# Patient Record
Sex: Male | Born: 1955 | Race: Black or African American | Hispanic: No | Marital: Married | Smoking: Current some day smoker
Health system: Southern US, Community
[De-identification: ages and names within clinical notes are randomized; demographics above are authoritative.]

## PROBLEM LIST (undated history)

## (undated) DIAGNOSIS — K219 Gastro-esophageal reflux disease without esophagitis: Secondary | ICD-10-CM

## (undated) DIAGNOSIS — K0889 Other specified disorders of teeth and supporting structures: Secondary | ICD-10-CM

## (undated) DIAGNOSIS — Z972 Presence of dental prosthetic device (complete) (partial): Secondary | ICD-10-CM

## (undated) DIAGNOSIS — Z974 Presence of external hearing-aid: Secondary | ICD-10-CM

## (undated) DIAGNOSIS — I10 Essential (primary) hypertension: Secondary | ICD-10-CM

## (undated) DIAGNOSIS — M1990 Unspecified osteoarthritis, unspecified site: Secondary | ICD-10-CM

## (undated) DIAGNOSIS — E119 Type 2 diabetes mellitus without complications: Secondary | ICD-10-CM

## (undated) DIAGNOSIS — C801 Malignant (primary) neoplasm, unspecified: Secondary | ICD-10-CM

## (undated) HISTORY — DX: Unspecified osteoarthritis, unspecified site: M19.90

## (undated) HISTORY — PX: EYE SURGERY: SHX253

## (undated) HISTORY — DX: Malignant (primary) neoplasm, unspecified: C80.1

---

## 2005-06-25 ENCOUNTER — Emergency Department: Payer: Self-pay | Admitting: Emergency Medicine

## 2005-11-22 DIAGNOSIS — C801 Malignant (primary) neoplasm, unspecified: Secondary | ICD-10-CM

## 2005-11-22 HISTORY — DX: Malignant (primary) neoplasm, unspecified: C80.1

## 2005-11-22 HISTORY — PX: RIB RESECTION: SHX5077

## 2006-04-11 ENCOUNTER — Other Ambulatory Visit: Payer: Self-pay

## 2006-04-11 ENCOUNTER — Emergency Department: Payer: Self-pay | Admitting: Emergency Medicine

## 2006-04-15 ENCOUNTER — Other Ambulatory Visit: Payer: Self-pay

## 2006-04-15 ENCOUNTER — Inpatient Hospital Stay: Payer: Self-pay | Admitting: Internal Medicine

## 2006-10-22 HISTORY — PX: LUNG SURGERY: SHX703

## 2007-06-18 IMAGING — CR DG CHEST 2V
1 series · 4 of 4 positions shown · non-contrast
Comparison: none

REASON FOR EXAM: Chest pain
COMMENTS:  LMP: (Male)

[Series 1: view not recorded · 0.17mm/px · 4 of 4 slices shown]
[im 1/4]
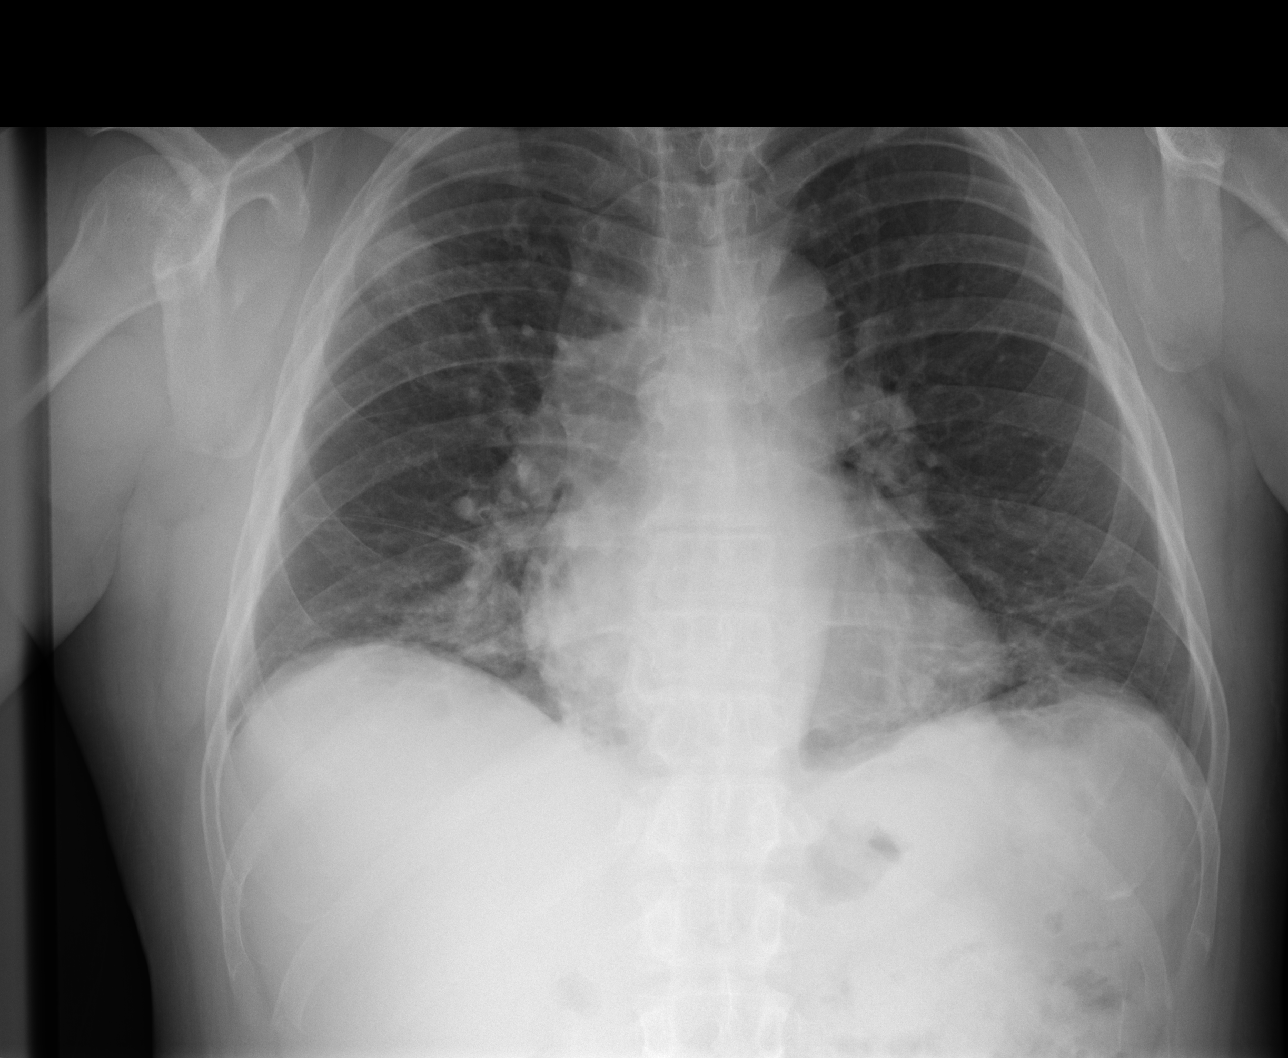
[im 2/4]
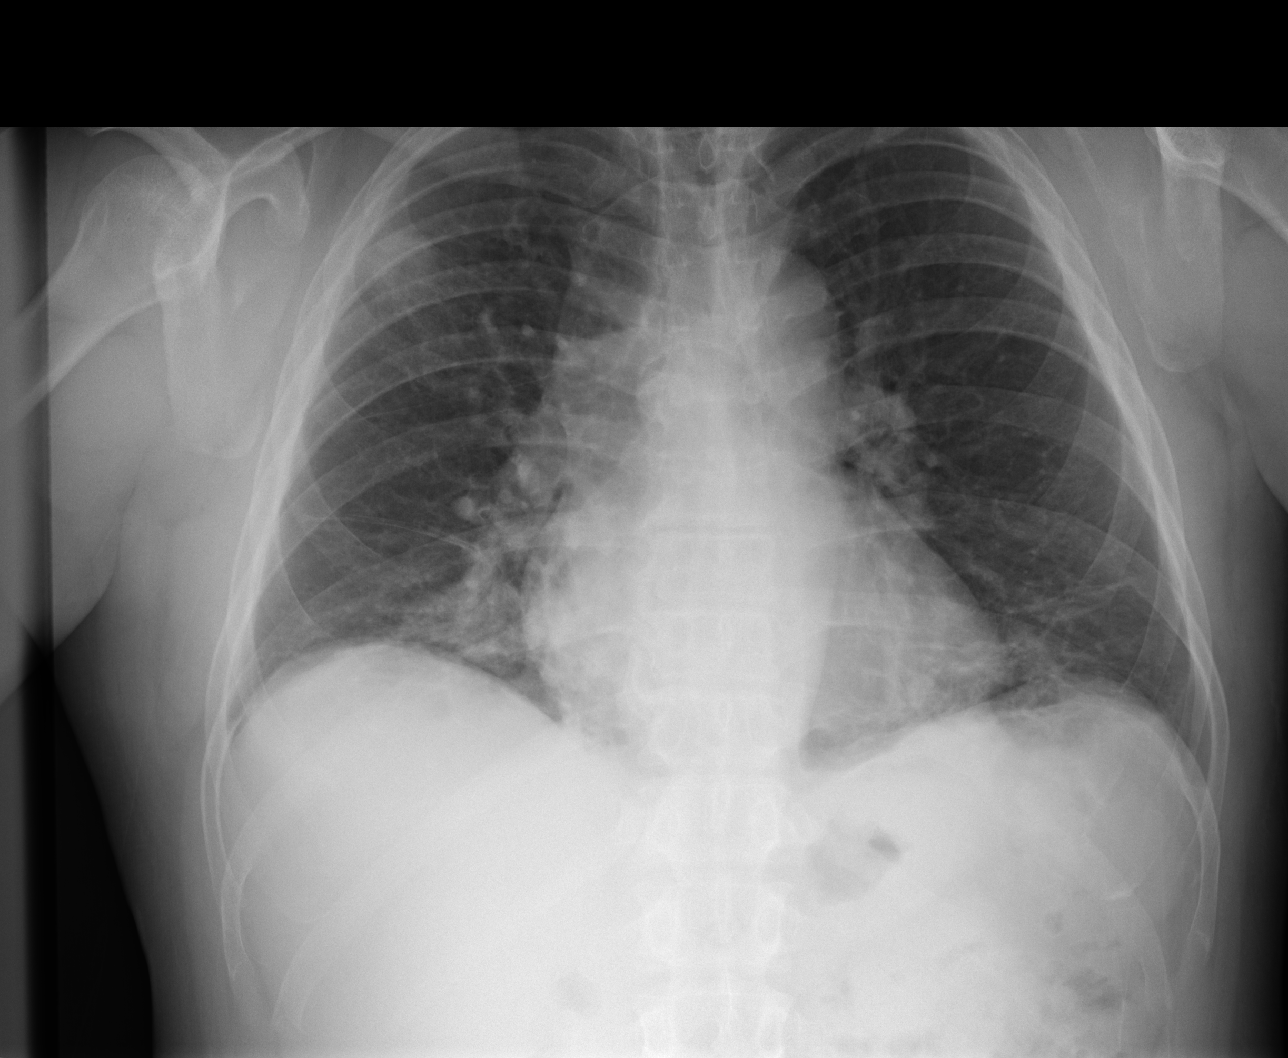
[im 3/4]
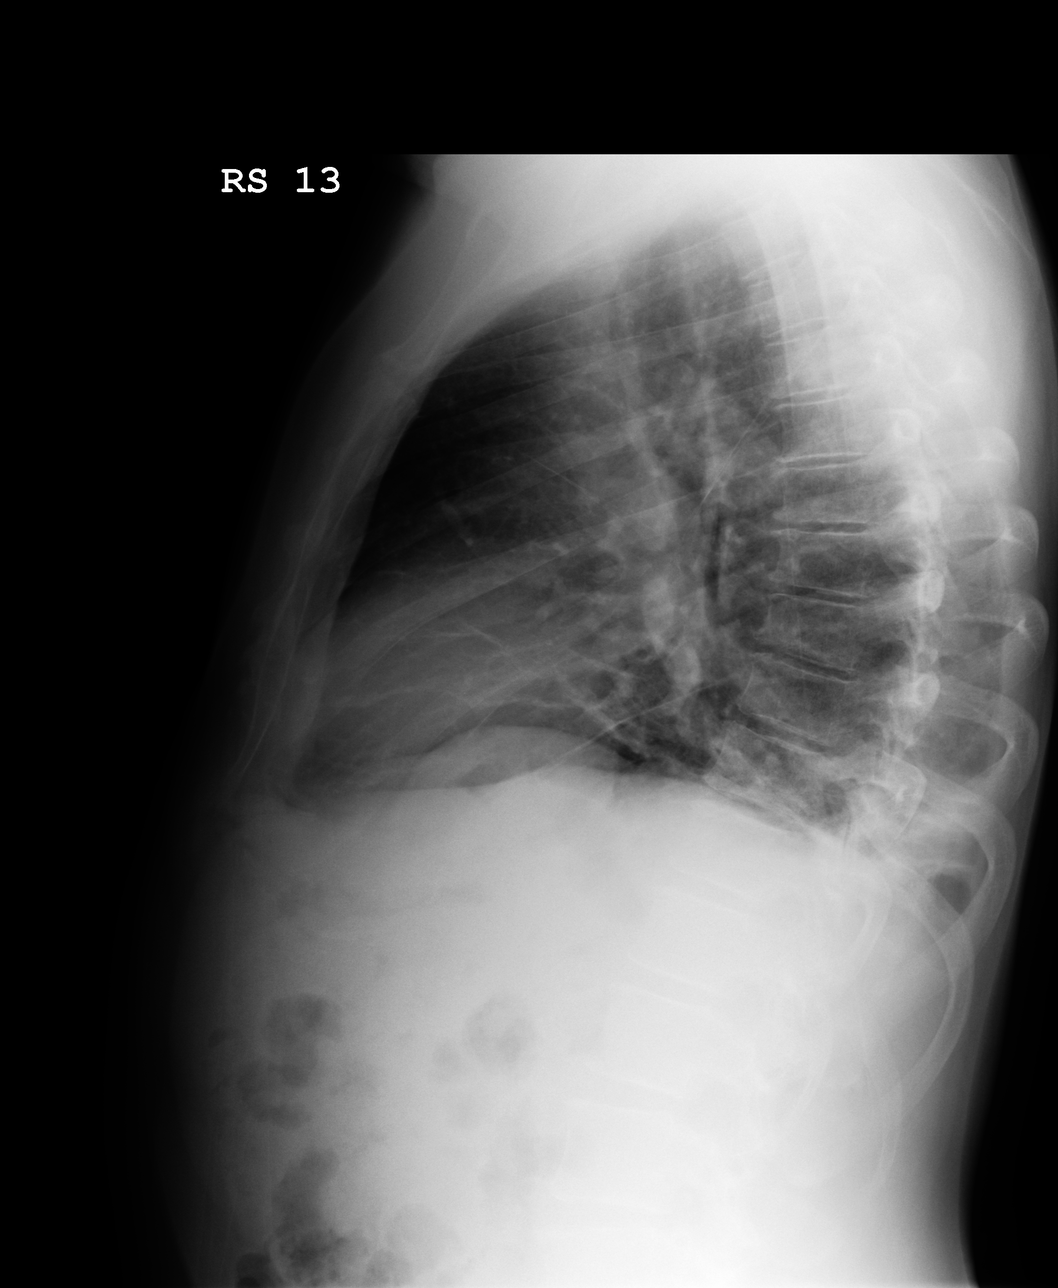
[im 4/4]
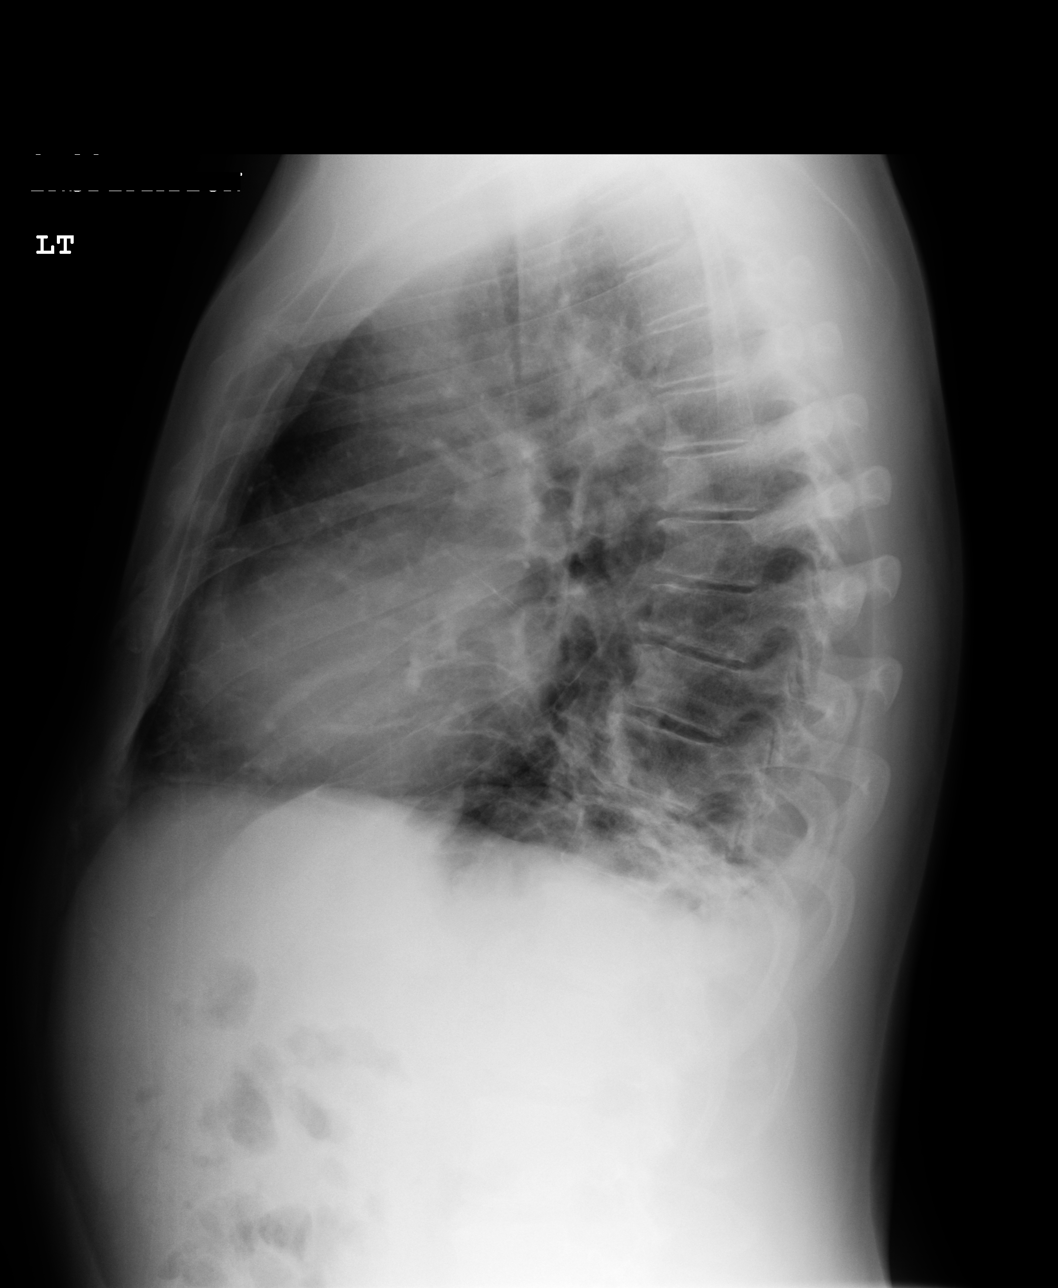

[4 of 4 positions shown; findings below may reference images not displayed]

PROCEDURE:     DXR - DXR CHEST PA (OR AP) AND LATERAL  - April 11, 2006  [DATE]

RESULT:     There is a patchy infiltrate in both lung bases compatible with
atelectasis or pneumonia. In addition, there is a concentric density in the
RIGHT upper lobe suspicious for RIGHT upper lobe pneumonia although this is
seen only in the PA view and could represent a fibrotic plaque. Follow-up
examination until clear is recommended. If the density in the RIGHT upper
lobe does not clear then further evaluation by CT may be needed to exclude a
nodule. Overall heart size is normal. There is no pleural effusion.
IMPRESSION: 1.     There is increased density in the lung bases bilaterally compatible
with pneumonia and/or atelectasis.
2.     There is a nonspecific density in the RIGHT upper lobe for which
follow-up examination is recommended.

## 2013-01-09 DIAGNOSIS — C3411 Malignant neoplasm of upper lobe, right bronchus or lung: Secondary | ICD-10-CM | POA: Insufficient documentation

## 2014-11-22 HISTORY — PX: COLONOSCOPY: SHX174

## 2014-12-23 ENCOUNTER — Emergency Department: Payer: Self-pay | Admitting: Emergency Medicine

## 2015-01-01 ENCOUNTER — Encounter: Payer: Self-pay | Admitting: General Surgery

## 2015-01-16 ENCOUNTER — Other Ambulatory Visit: Payer: Self-pay | Admitting: *Deleted

## 2015-01-16 ENCOUNTER — Ambulatory Visit (INDEPENDENT_AMBULATORY_CARE_PROVIDER_SITE_OTHER): Payer: Medicare Other | Admitting: General Surgery

## 2015-01-16 ENCOUNTER — Encounter: Payer: Self-pay | Admitting: General Surgery

## 2015-01-16 ENCOUNTER — Other Ambulatory Visit: Payer: Self-pay | Admitting: General Surgery

## 2015-01-16 VITALS — BP 118/82 | HR 70 | Resp 12 | Ht 63.0 in | Wt 210.0 lb

## 2015-01-16 DIAGNOSIS — Z1211 Encounter for screening for malignant neoplasm of colon: Secondary | ICD-10-CM

## 2015-01-16 MED ORDER — POLYETHYLENE GLYCOL 3350 17 GM/SCOOP PO POWD: ORAL | Status: DC

## 2015-01-16 NOTE — Progress Notes (Signed)
Patient ID: Larry Luna, male   DOB: [birth year], 59 y.o.   MRN: [id number]  Chief Complaint  Patient presents with  . Other    evaluation of screening colonoscopy    HPI Larry Luna is a 59 y.o. male who presents for an evaluation of a colonoscopy. No prior colonoscopies done before. No known family history of colon problems. He denies any problems with his bowels at this time.  The patient reports he was encouraged both by his primary care physician and the physicians at [facility] where he is followed for his lung cancer to have a screening exam.  In spite of his previous lung cancer, which was in part attributed to chemical exposure [place], he still smokes.   He denies any dyspnea with exertion. While he is been disabled since his lung surgery he does care for his yard without difficulty.  HPI  Past Medical History  Diagnosis Date  . Cancer 2007    lung  . Arthritis     Past Surgical History  Procedure Laterality Date  . Lung surgery Right 10/2006    lung cancer surgery - [facility]  . Rib resection Right 2007    Family History  Problem Relation Age of Onset  . Heart attack Father   . Heart disease Mother   . Cancer Sister     breast    Social History History  Substance Use Topics  . Smoking status: Current Every Day Smoker -- 0.50 packs/day for 2 years  . Smokeless tobacco: Never Used  . Alcohol Use: No    Allergies  Allergen Reactions  . Bee Venom Anaphylaxis    Current Outpatient Prescriptions  Medication Sig Dispense Refill  . acetaminophen (TYLENOL) 325 MG tablet Take 650 mg by mouth as needed.    Marland Kitchen aspirin 81 MG tablet Take 81 mg by mouth daily.     No current facility-administered medications for this visit.    Review of Systems Review of Systems  Constitutional: Negative.   Respiratory: Negative.   Cardiovascular: Negative.   Gastrointestinal: Negative.     Blood pressure 118/82, pulse 70, resp. rate 12, height 5\' 3"  (1.6  m), weight 210 lb (95.255 kg).  Physical Exam Physical Exam  Constitutional: He is oriented to person, place, and time. He appears well-developed and well-nourished.  Neck: Neck supple. No thyromegaly present.  Cardiovascular: Normal rate, regular rhythm and normal heart sounds.   No murmur heard. Pulmonary/Chest: Effort normal and breath sounds normal.  Lymphadenopathy:    He has no cervical adenopathy.  Neurological: He is alert and oriented to person, place, and time.  Skin: Skin is warm and dry.    Data Reviewed PCP records from 07/26/2014 were reviewed.  Laboratory studies of the same date showed a hemoglobin of 15.9 with an MCV of 81, white blood cell count of 8300. Platelets 234,000. Creatinine 1.14 with an estimated GFR of 82. Normal electrolytes. Normal PSA of 3.6.  Assessment    Candidate for screening colonoscopy.    Plan    Patient has been scheduled for a colonoscopy on 02-12-15 at [facility]. It is okay for patient to continue 81 mg aspirin once daily.    PCP/Ref MD:  Henderson Baltimore 01/16/2015, 9:50 AM

## 2015-01-16 NOTE — Patient Instructions (Addendum)
Colonoscopy A colonoscopy is an exam to look at the entire large intestine (colon). This exam can help find problems such as tumors, polyps, inflammation, and areas of bleeding. The exam takes about 1 hour.  LET [facility] CARE PROVIDER KNOW ABOUT:   Any allergies you have.  All medicines you are taking, including vitamins, herbs, eye drops, creams, and over-the-counter medicines.  Previous problems you or members of your family have had with the use of anesthetics.  Any blood disorders you have.  Previous surgeries you have had.  Medical conditions you have. RISKS AND COMPLICATIONS  Generally, this is a safe procedure. However, as with any procedure, complications can occur. Possible complications include:  Bleeding.  Tearing or rupture of the colon wall.  Reaction to medicines given during the exam.  Infection (rare). BEFORE THE PROCEDURE   Ask your health care provider about changing or stopping your regular medicines.  You may be prescribed an oral bowel prep. This involves drinking a large amount of medicated liquid, starting the day before your procedure. The liquid will cause you to have multiple loose stools until your stool is almost clear or light green. This cleans out your colon in preparation for the procedure.  Do not eat or drink anything else once you have started the bowel prep, unless your health care provider tells you it is safe to do so.  Arrange for someone to drive you home after the procedure. PROCEDURE   You will be given medicine to help you relax (sedative).  You will lie on your side with your knees bent.  A long, flexible tube with a light and camera on the end (colonoscope) will be inserted through the rectum and into the colon. The camera sends video back to a computer screen as it moves through the colon. The colonoscope also releases carbon dioxide gas to inflate the colon. This helps your health care provider see the area better.  During  the exam, your health care provider may take a small tissue sample (biopsy) to be examined under a microscope if any abnormalities are found.  The exam is finished when the entire colon has been viewed. AFTER THE PROCEDURE   Do not drive for 24 hours after the exam.  You may have a small amount of blood in your stool.  You may pass moderate amounts of gas and have mild abdominal cramping or bloating. This is caused by the gas used to inflate your colon during the exam.  Ask when your test results will be ready and how you will get your results. Make sure you get your test results. Document Released: 11/05/2000 Document Revised: 08/29/2013 Document Reviewed: 07/16/2013 [facility] Patient Information 2015 [place], [place]. This information is not intended to replace advice given to you by your health care provider. Make sure you discuss any questions you have with your health care provider.  Patient has been scheduled for a colonoscopy on 02-12-15 at [facility]. It is okay for patient to continue 81 mg aspirin once daily.

## 2015-02-12 ENCOUNTER — Ambulatory Visit: Payer: Self-pay | Admitting: General Surgery

## 2015-02-12 DIAGNOSIS — D122 Benign neoplasm of ascending colon: Secondary | ICD-10-CM | POA: Diagnosis not present

## 2015-02-12 DIAGNOSIS — D125 Benign neoplasm of sigmoid colon: Secondary | ICD-10-CM | POA: Diagnosis not present

## 2015-02-12 DIAGNOSIS — D124 Benign neoplasm of descending colon: Secondary | ICD-10-CM | POA: Diagnosis not present

## 2015-02-12 DIAGNOSIS — K621 Rectal polyp: Secondary | ICD-10-CM | POA: Diagnosis not present

## 2015-02-13 ENCOUNTER — Encounter: Payer: Self-pay | Admitting: General Surgery

## 2015-02-17 ENCOUNTER — Encounter: Payer: Self-pay | Admitting: General Surgery

## 2015-02-17 ENCOUNTER — Telehealth: Payer: Self-pay

## 2015-02-17 NOTE — Telephone Encounter (Signed)
-----   Message from Robert Bellow, MD sent at 02/17/2015 10:29 AM EDT ----- Please notify the patient although polyps removed from his colonoscopy in March 23 were benign. Because he had 4 polyps a follow-up exam in 3 years is recommended. Please put him in the reminder file. Thank yo ----- Message -----    From: Augustin Schooling, CMA    Sent: 02/17/2015  10:18 AM      To: Robert Bellow, MD

## 2015-02-17 NOTE — Telephone Encounter (Signed)
Notified patient as instructed, patient pleased. Discussed follow-up in 3 years, patient agrees. Patient placed in recalls for a return in 3 years.

## 2015-03-17 LAB — SURGICAL PATHOLOGY

## 2017-08-06 ENCOUNTER — Emergency Department
Admission: EM | Admit: 2017-08-06 | Discharge: 2017-08-06 | Disposition: A | Discharge status: Home / Self Care | Payer: Medicare HMO | Attending: Emergency Medicine | Admitting: Emergency Medicine

## 2017-08-06 ENCOUNTER — Emergency Department: Payer: Medicare HMO

## 2017-08-06 ENCOUNTER — Encounter: Payer: Self-pay | Admitting: Medical Oncology

## 2017-08-06 DIAGNOSIS — Y9389 Activity, other specified: Secondary | ICD-10-CM | POA: Diagnosis not present

## 2017-08-06 DIAGNOSIS — Z79899 Other long term (current) drug therapy: Secondary | ICD-10-CM | POA: Diagnosis not present

## 2017-08-06 DIAGNOSIS — S4992XA Unspecified injury of left shoulder and upper arm, initial encounter: Secondary | ICD-10-CM | POA: Diagnosis present

## 2017-08-06 DIAGNOSIS — Y999 Unspecified external cause status: Secondary | ICD-10-CM | POA: Insufficient documentation

## 2017-08-06 DIAGNOSIS — S43402A Unspecified sprain of left shoulder joint, initial encounter: Secondary | ICD-10-CM | POA: Insufficient documentation

## 2017-08-06 DIAGNOSIS — Z85118 Personal history of other malignant neoplasm of bronchus and lung: Secondary | ICD-10-CM | POA: Diagnosis not present

## 2017-08-06 DIAGNOSIS — F17210 Nicotine dependence, cigarettes, uncomplicated: Secondary | ICD-10-CM | POA: Diagnosis not present

## 2017-08-06 DIAGNOSIS — Y929 Unspecified place or not applicable: Secondary | ICD-10-CM | POA: Insufficient documentation

## 2017-08-06 DIAGNOSIS — X500XXA Overexertion from strenuous movement or load, initial encounter: Secondary | ICD-10-CM | POA: Diagnosis not present

## 2017-08-06 DIAGNOSIS — S43401A Unspecified sprain of right shoulder joint, initial encounter: Secondary | ICD-10-CM

## 2017-08-06 DIAGNOSIS — M1990 Unspecified osteoarthritis, unspecified site: Secondary | ICD-10-CM | POA: Diagnosis not present

## 2017-08-06 DIAGNOSIS — M25512 Pain in left shoulder: Secondary | ICD-10-CM

## 2017-08-06 MED ORDER — DEXAMETHASONE SODIUM PHOSPHATE 10 MG/ML IJ SOLN
10.0000 mg | Freq: Once | INTRAMUSCULAR | Status: AC
  Administered 2017-08-06: 10 mg via INTRAMUSCULAR
  Filled 2017-08-06: qty 1

## 2017-08-06 MED ORDER — CYCLOBENZAPRINE HCL 10 MG PO TABS: 10.0000 mg | ORAL_TABLET | Freq: Three times a day (TID) | ORAL | 0 refills | Status: DC | PRN

## 2017-08-06 MED ORDER — ORPHENADRINE CITRATE 30 MG/ML IJ SOLN
60.0000 mg | Freq: Two times a day (BID) | INTRAMUSCULAR | Status: DC
  Administered 2017-08-06: 60 mg via INTRAMUSCULAR
  Filled 2017-08-06: qty 2

## 2017-08-06 MED ORDER — TRAMADOL HCL 50 MG PO TABS: 50.0000 mg | ORAL_TABLET | Freq: Four times a day (QID) | ORAL | 0 refills | Status: DC | PRN

## 2017-08-06 MED ORDER — PREDNISONE 10 MG (21) PO TBPK: ORAL_TABLET | ORAL | 0 refills | Status: DC

## 2017-08-06 NOTE — ED Provider Notes (Signed)
[facility] Emergency Department Provider Note   ____________________________________________   I have reviewed the triage vital signs and the nursing notes.   HISTORY  Chief Complaint Shoulder Pain    HPI Larry Luna is a 61 y.o. male presents to the emergency department with left shoulder pain that began yesterday after lifting deck boards for several hours. Patient reports lifting, pushing, pulling old deck boards for several hours followed by gradual worsening of left shoulder pain. Patient is right-hand dominant and denies right upper charity pain. Patient reports constant deep ache to the left shoulder with significant increase in pain with attempts to actively move the shoulder in any direction. Patient denies any sensation changes to the left upper extremity and pulses are intact. Patient denies any deficits to left elbow, wrist or hand functional movement. Patient localizes pain to the left bicep, insertion of the pectoralis, AC joint, insertion of the supraspinatus and left scapula. Patient denies noting any possibility of dislocation or subluxation of the left shoulder joint. Patient denies fever, chills, headache, vision changes, chest pain, chest tightness, shortness of breath, abdominal pain, nausea and vomiting.  Past Medical History:  Diagnosis Date  . Arthritis   . Cancer [facility]) 2007   lung    Patient Active Problem List   Diagnosis Date Noted  . Encounter for screening colonoscopy 01/16/2015    Past Surgical History:  Procedure Laterality Date  . LUNG SURGERY Right 10/2006   lung cancer surgery - [facility]  . RIB RESECTION Right 2007    Prior to Admission medications   Medication Sig Start Date End Date Taking? Authorizing Provider  acetaminophen (TYLENOL) 325 MG tablet Take 650 mg by mouth as needed.    [provider]  aspirin 81 MG tablet Take 81 mg by mouth daily.    [provider]  cyclobenzaprine (FLEXERIL) 10  MG tablet Take 1 tablet (10 mg total) by mouth 3 (three) times daily as needed for muscle spasms. 08/06/17   Alden Bensinger M, PA-C  polyethylene glycol powder (GLYCOLAX/MIRALAX) powder 255 grams one bottle for colonoscopy prep 01/16/15   Robert Bellow, MD  predniSONE (STERAPRED UNI-PAK 21 TAB) 10 MG (21) TBPK tablet Take 6 tablets on day 1. Take 5 tablets on day 2. Take 4 tablets on day 3. Take 3 tablets on day 4. Take 2 tablets on day 5. Take 1 tablets on day 6. 08/06/17   Hawa Henly M, PA-C  traMADol (ULTRAM) 50 MG tablet Take 1 tablet (50 mg total) by mouth every 6 (six) hours as needed. 08/06/17 08/06/18  Melita Villalona M, PA-C    Allergies Bee venom  Family History  Problem Relation Age of Onset  . Heart attack Father   . Heart disease Mother   . Cancer Sister        breast    Social History Social History  Substance Use Topics  . Smoking status: Current Every Day Smoker    Packs/day: 0.50    Years: 2.00  . Smokeless tobacco: Never Used  . Alcohol use No    Review of Systems Constitutional: Negative for fever/chills Eyes: No visual changes. ENT:  Negative for sore throat and for difficulty swallowing Cardiovascular: Denies chest pain. Respiratory: Denies cough. Denies shortness of breath. Gastrointestinal: No abdominal pain.  No nausea, vomiting, diarrhea. Genitourinary: Negative for dysuria. Musculoskeletal: Left shoulder pain. Skin: Negative for rash. Neurological: Negative for headaches.  ____________________________________________   PHYSICAL EXAM:  VITAL SIGNS: ED Triage Vitals  Enc Vitals Group     BP 08/06/17 1001 140/73     Pulse Rate 08/06/17 1001 90     Resp 08/06/17 1001 15     Temp 08/06/17 1001 100 F (37.8 C)     Temp Source 08/06/17 1001 Oral     SpO2 08/06/17 1001 98 %     Weight 08/06/17 0951 210 lb (95.3 kg)     Height 08/06/17 1001 5\' 3"  (1.6 m)     Head Circumference --      Peak Flow --      Pain Score 08/06/17 0951 10     Pain Loc  --      Pain Edu? --      Excl. in [place]? --     Constitutional: Alert and oriented. Well appearing and in no acute distress.  Eyes: Conjunctivae are normal. PERRL. EOMI  Head: Normocephalic and atraumatic. ENT:      Ears: Canals clear. TMs intact bilaterally.      Nose: No congestion/rhinnorhea.      Mouth/Throat: Mucous membranes are moist.  Neck:Supple. No thyromegaly. No stridor.  Cardiovascular: Normal rate, regular rhythm. Normal S1 and S2.  Good peripheral circulation. Respiratory: Normal respiratory effort without tachypnea or retractions. Lungs CTAB. No wheezes/rales/rhonchi.  Hematological/Lymphatic/Immunological: No cervical lymphadenopathy. Cardiovascular: Normal rate, regular rhythm. Normal distal pulses. Gastrointestinal: Bowel sounds 4 quadrants. Soft and nontender to palpation.No CVA tenderness. Musculoskeletal:Left shoulder pain described as deep ache at rest. Increase pain with shoulder flexion and abduction. Unable to tolerate motions past 70 due to pain. No deformities or restriction motions due to hard or bony end-feels. Palpable tenderness along left biceps tendon, AC joint, insertion of the supraspinatus and muscle belly of the infraspinatus and teres minor. Intact sensation of the left upper extremity. Neurologic: Normal speech and language.  Skin:  Skin is warm, dry and intact. No rash noted. Psychiatric: Mood and affect are normal. Speech and behavior are normal. Patient exhibits appropriate insight and judgement.  ____________________________________________   LABS (all labs ordered are listed, but only abnormal results are displayed)  Labs Reviewed - No data to display ____________________________________________  EKG None ____________________________________________  RADIOLOGY Left shoulder complete FINDINGS: No fracture or dislocation. Calcifications superior to the humeral head are likely degenerative. These may be associated with the rotator cuff  tendon but a loose body is not excluded. No other acute abnormalities.  IMPRESSION: Degenerative changes as above. A loose body is not excluded. ____________________________________________   PROCEDURES  Procedure(s) performed: no    Critical Care performed: no ____________________________________________   INITIAL IMPRESSION / ASSESSMENT AND PLAN / ED COURSE  Pertinent labs & imaging results that were available during my care of the patient were reviewed by me and considered in my medical decision making (see chart for details).  Patient presents to emergency department with left shoulder pain. History, physical exam findings amd imaging are reassuring symptoms are consistent with exacerbation of osteoarthritis and acute biceps and rotator cuff tendinitis. Patient responded well to Decadron and Norflex during the course of care in the emergency department. Patient will be prescribed prednisone taper in addition to Flexeril as needed for muscle spasms and tramadol for pain. Patient advised to follow up with orthopedics for continued care or return to the emergency department if symptoms return or worsen. Patient informed of clinical course, understand medical decision-making process, and agree with plan.  ____________________________________________   FINAL CLINICAL IMPRESSION(S) / ED DIAGNOSES  Final diagnoses:  Acute pain of left shoulder  Sprain of right shoulder, unspecified shoulder sprain type, initial encounter       NEW MEDICATIONS STARTED DURING THIS VISIT:  Discharge Medication List as of 08/06/2017 11:53 AM    START taking these medications   Details  cyclobenzaprine (FLEXERIL) 10 MG tablet Take 1 tablet (10 mg total) by mouth 3 (three) times daily as needed for muscle spasms., Starting Sat 08/06/2017, Print    predniSONE (STERAPRED UNI-PAK 21 TAB) 10 MG (21) TBPK tablet Take 6 tablets on day 1. Take 5 tablets on day 2. Take 4 tablets on day 3. Take 3 tablets  on day 4. Take 2 tablets on day 5. Take 1 tablets on day 6., Print    traMADol (ULTRAM) 50 MG tablet Take 1 tablet (50 mg total) by mouth every 6 (six) hours as needed., Starting Sat 08/06/2017, Until Sun 08/06/2018, Print         Note:  This document was prepared using Dragon voice recognition software and may include unintentional dictation errors.    Jerolyn Shin, PA-C 08/06/17 1212

## 2017-08-06 NOTE — ED Triage Notes (Signed)
Pt reports since Thursday he has been having pain to left shoulder with knot to area. Pt reports pain with movement. Denies injury.

## 2017-08-06 NOTE — Discharge Instructions (Signed)
Wear the shoulder immobilizer sling for comfort and support as needed.  Take medication as prescribed. Return to emergency department if symptoms worsen and follow-up with PCP as needed.

## 2017-08-17 ENCOUNTER — Ambulatory Visit
Admission: RE | Admit: 2017-08-17 | Discharge: 2017-08-17 | Disposition: A | Discharge status: Home / Self Care | Payer: Medicare HMO | Source: Ambulatory Visit | Attending: Internal Medicine | Admitting: Internal Medicine

## 2017-08-17 ENCOUNTER — Other Ambulatory Visit: Payer: Self-pay | Admitting: Internal Medicine

## 2017-08-17 DIAGNOSIS — Z85118 Personal history of other malignant neoplasm of bronchus and lung: Secondary | ICD-10-CM | POA: Insufficient documentation

## 2017-08-18 DIAGNOSIS — R634 Abnormal weight loss: Secondary | ICD-10-CM | POA: Insufficient documentation

## 2017-08-18 DIAGNOSIS — K219 Gastro-esophageal reflux disease without esophagitis: Secondary | ICD-10-CM | POA: Insufficient documentation

## 2017-08-18 DIAGNOSIS — E875 Hyperkalemia: Secondary | ICD-10-CM | POA: Insufficient documentation

## 2017-08-18 DIAGNOSIS — R1310 Dysphagia, unspecified: Secondary | ICD-10-CM | POA: Insufficient documentation

## 2017-08-18 DIAGNOSIS — N179 Acute kidney failure, unspecified: Secondary | ICD-10-CM | POA: Insufficient documentation

## 2017-09-16 NOTE — Discharge Instructions (Signed)

## 2017-09-21 ENCOUNTER — Ambulatory Visit
Admission: RE | Admit: 2017-09-21 | Discharge: 2017-09-21 | Disposition: A | Discharge status: Home / Self Care | Payer: Medicare HMO | Source: Ambulatory Visit | Attending: Ophthalmology | Admitting: Ophthalmology

## 2017-09-21 ENCOUNTER — Encounter
Admission: RE | Disposition: A | Discharge status: Home / Self Care | Payer: Self-pay | Source: Ambulatory Visit | Attending: Ophthalmology

## 2017-09-21 ENCOUNTER — Ambulatory Visit: Payer: Medicare HMO | Admitting: Anesthesiology

## 2017-09-21 DIAGNOSIS — M1990 Unspecified osteoarthritis, unspecified site: Secondary | ICD-10-CM | POA: Insufficient documentation

## 2017-09-21 DIAGNOSIS — Z87891 Personal history of nicotine dependence: Secondary | ICD-10-CM | POA: Insufficient documentation

## 2017-09-21 DIAGNOSIS — R011 Cardiac murmur, unspecified: Secondary | ICD-10-CM | POA: Insufficient documentation

## 2017-09-21 DIAGNOSIS — K219 Gastro-esophageal reflux disease without esophagitis: Secondary | ICD-10-CM | POA: Diagnosis not present

## 2017-09-21 DIAGNOSIS — H2512 Age-related nuclear cataract, left eye: Secondary | ICD-10-CM | POA: Diagnosis present

## 2017-09-21 DIAGNOSIS — Z6835 Body mass index (BMI) 35.0-35.9, adult: Secondary | ICD-10-CM | POA: Diagnosis not present

## 2017-09-21 DIAGNOSIS — I10 Essential (primary) hypertension: Secondary | ICD-10-CM | POA: Insufficient documentation

## 2017-09-21 DIAGNOSIS — Z85118 Personal history of other malignant neoplasm of bronchus and lung: Secondary | ICD-10-CM | POA: Diagnosis not present

## 2017-09-21 DIAGNOSIS — Z902 Acquired absence of lung [part of]: Secondary | ICD-10-CM | POA: Insufficient documentation

## 2017-09-21 HISTORY — DX: Gastro-esophageal reflux disease without esophagitis: K21.9

## 2017-09-21 HISTORY — DX: Essential (primary) hypertension: I10

## 2017-09-21 HISTORY — DX: Presence of dental prosthetic device (complete) (partial): Z97.2

## 2017-09-21 HISTORY — DX: Other specified disorders of teeth and supporting structures: K08.89

## 2017-09-21 HISTORY — PX: CATARACT EXTRACTION W/PHACO: SHX586

## 2017-09-21 HISTORY — DX: Presence of external hearing-aid: Z97.4

## 2017-09-21 SURGERY — PHACOEMULSIFICATION, CATARACT, WITH IOL INSERTION
Anesthesia: Monitor Anesthesia Care | Laterality: Left | Wound class: Clean

## 2017-09-21 MED ORDER — BRIMONIDINE TARTRATE-TIMOLOL 0.2-0.5 % OP SOLN
OPHTHALMIC | Status: DC | PRN
  Administered 2017-09-21: 1 [drp] via OPHTHALMIC

## 2017-09-21 MED ORDER — LACTATED RINGERS IV SOLN: 10.0000 mL/h | INTRAVENOUS | Status: DC

## 2017-09-21 MED ORDER — EPINEPHRINE PF 1 MG/ML IJ SOLN
INTRAOCULAR | Status: DC | PRN
  Administered 2017-09-21: 63 mL via OPHTHALMIC

## 2017-09-21 MED ORDER — ARMC OPHTHALMIC DILATING DROPS
1.0000 "application " | OPHTHALMIC | Status: DC | PRN
  Administered 2017-09-21 (×3): 1 via OPHTHALMIC

## 2017-09-21 MED ORDER — CEFUROXIME OPHTHALMIC INJECTION 1 MG/0.1 ML
INJECTION | OPHTHALMIC | Status: DC | PRN
  Administered 2017-09-21: 0.1 mL via INTRACAMERAL

## 2017-09-21 MED ORDER — NA HYALUR & NA CHOND-NA HYALUR 0.4-0.35 ML IO KIT
PACK | INTRAOCULAR | Status: DC | PRN
  Administered 2017-09-21: 1 mL via INTRAOCULAR

## 2017-09-21 MED ORDER — BALANCED SALT IO SOLN
INTRAOCULAR | Status: DC | PRN
  Administered 2017-09-21: 1 mL via INTRAMUSCULAR

## 2017-09-21 MED ORDER — FENTANYL CITRATE (PF) 100 MCG/2ML IJ SOLN
INTRAMUSCULAR | Status: DC | PRN
  Administered 2017-09-21: 50 ug via INTRAVENOUS

## 2017-09-21 MED ORDER — MIDAZOLAM HCL 2 MG/2ML IJ SOLN
INTRAMUSCULAR | Status: DC | PRN
  Administered 2017-09-21: 2 mg via INTRAVENOUS

## 2017-09-21 MED ORDER — MOXIFLOXACIN HCL 0.5 % OP SOLN
1.0000 [drp] | OPHTHALMIC | Status: DC | PRN
  Administered 2017-09-21 (×3): 1 [drp] via OPHTHALMIC

## 2017-09-21 SURGICAL SUPPLY — 25 items
CANNULA ANT/CHMB 27GA (MISCELLANEOUS) ×3 IMPLANT
CARTRIDGE ABBOTT (MISCELLANEOUS) IMPLANT
GLOVE SURG LX 7.5 STRW (GLOVE) ×2
GLOVE SURG LX STRL 7.5 STRW (GLOVE) ×1 IMPLANT
GLOVE SURG TRIUMPH 8.0 PF LTX (GLOVE) ×3 IMPLANT
GOWN STRL REUS W/ TWL LRG LVL3 (GOWN DISPOSABLE) ×2 IMPLANT
GOWN STRL REUS W/TWL LRG LVL3 (GOWN DISPOSABLE) ×4
LENS IOL TECNIS ITEC 20.0 (Intraocular Lens) ×3 IMPLANT
MARKER SKIN DUAL TIP RULER LAB (MISCELLANEOUS) ×3 IMPLANT
NDL RETROBULBAR .5 NSTRL (NEEDLE) IMPLANT
NEEDLE FILTER BLUNT 18X 1/2SAF (NEEDLE) ×2
NEEDLE FILTER BLUNT 18X1 1/2 (NEEDLE) ×1 IMPLANT
PACK CATARACT BRASINGTON (MISCELLANEOUS) ×3 IMPLANT
PACK EYE AFTER SURG (MISCELLANEOUS) ×3 IMPLANT
PACK OPTHALMIC (MISCELLANEOUS) ×3 IMPLANT
RING MALYGIN 7.0 (MISCELLANEOUS) IMPLANT
SUT ETHILON 10-0 CS-B-6CS-B-6 (SUTURE)
SUT VICRYL  9 0 (SUTURE)
SUT VICRYL 9 0 (SUTURE) IMPLANT
SUTURE EHLN 10-0 CS-B-6CS-B-6 (SUTURE) IMPLANT
SYR 3ML LL SCALE MARK (SYRINGE) ×3 IMPLANT
SYR 5ML LL (SYRINGE) ×3 IMPLANT
SYR TB 1ML LUER SLIP (SYRINGE) ×3 IMPLANT
WATER STERILE IRR 250ML POUR (IV SOLUTION) ×3 IMPLANT
WIPE NON LINTING 3.25X3.25 (MISCELLANEOUS) ×3 IMPLANT

## 2017-09-21 NOTE — Anesthesia Postprocedure Evaluation (Signed)
Anesthesia Post Note  Patient: Larry Luna  Procedure(s) Performed: CATARACT EXTRACTION PHACO AND INTRAOCULAR LENS PLACEMENT (IOC)  LEFT (Left )  Patient location during evaluation: PACU Anesthesia Type: MAC Level of consciousness: awake Pain management: pain level controlled Vital Signs Assessment: post-procedure vital signs reviewed and stable Respiratory status: respiratory function stable Cardiovascular status: stable Postop Assessment: no signs of nausea or vomiting Anesthetic complications: no    Veda Canning

## 2017-09-21 NOTE — Anesthesia Procedure Notes (Signed)
Procedure Name: MAC Performed by: Kekoa Fyock Pre-anesthesia Checklist: Patient identified, Emergency Drugs available, Suction available, Timeout performed and Patient being monitored Patient Re-evaluated:Patient Re-evaluated prior to inductionOxygen Delivery Method: Nasal cannula Placement Confirmation: positive ETCO2     

## 2017-09-21 NOTE — Anesthesia Preprocedure Evaluation (Signed)
Anesthesia Evaluation  Patient identified by MRN, date of birth, ID band Patient awake    Reviewed: Allergy & Precautions, NPO status   Airway Mallampati: II  TM Distance: >3 FB     Dental  (+) Missing   Pulmonary neg pulmonary ROS, Current Smoker,    breath sounds clear to auscultation       Cardiovascular hypertension,  Rhythm:Regular Rate:Normal     Neuro/Psych    GI/Hepatic GERD  Controlled,  Endo/Other  negative endocrine ROSMorbid obesity (BMI 35)  Renal/GU      Musculoskeletal  (+) Arthritis ,   Abdominal   Peds  Hematology   Anesthesia Other Findings   Reproductive/Obstetrics                            Anesthesia Physical Anesthesia Plan  ASA: II  Anesthesia Plan: MAC   Post-op Pain Management:    Induction:   PONV Risk Score and Plan:   Airway Management Planned: Nasal Cannula  Additional Equipment:   Intra-op Plan:   Post-operative Plan:   Informed Consent: I have reviewed the patients History and Physical, chart, labs and discussed the procedure including the risks, benefits and alternatives for the proposed anesthesia with the patient or authorized representative who has indicated his/her understanding and acceptance.     Plan Discussed with: CRNA  Anesthesia Plan Comments:         Anesthesia Quick Evaluation

## 2017-09-21 NOTE — Op Note (Signed)
OPERATIVE NOTE  SOREN LAZARZ [id number] 09/21/2017   PREOPERATIVE DIAGNOSIS:  Nuclear sclerotic cataract left eye. H25.12   POSTOPERATIVE DIAGNOSIS:    Nuclear sclerotic cataract left eye.     PROCEDURE:  Phacoemusification with posterior chamber intraocular lens placement of the left eye   LENS:   Implant Name Type Inv. Item Serial No. Manufacturer Lot No. LRB No. Used  LENS IOL DIOP 20.0 - [id number] Intraocular Lens LENS IOL DIOP 20.0 [id number]   Left 1        ULTRASOUND TIME: 10  % of 0 minutes 40 seconds, CDE 4.0  SURGEON:  Wyonia Hough, MD   ANESTHESIA:  Topical with tetracaine drops and 2% Xylocaine jelly, augmented with 1% preservative-free intracameral lidocaine.    COMPLICATIONS:  None.   DESCRIPTION OF PROCEDURE:  The patient was identified in the holding room and transported to the operating room and placed in the supine position under the operating microscope.  The left eye was identified as the operative eye and it was prepped and draped in the usual sterile ophthalmic fashion.   A 1 millimeter clear-corneal paracentesis was made at the 1:30 position.  0.5 ml of preservative-free 1% lidocaine was injected into the anterior chamber.  The anterior chamber was filled with Viscoat viscoelastic.  A 2.4 millimeter keratome was used to make a near-clear corneal incision at the 10:30 position.  .  A curvilinear capsulorrhexis was made with a cystotome and capsulorrhexis forceps.  Balanced salt solution was used to hydrodissect and hydrodelineate the nucleus.   Phacoemulsification was then used in stop and chop fashion to remove the lens nucleus and epinucleus.  The remaining cortex was then removed using the irrigation and aspiration handpiece. Provisc was then placed into the capsular bag to distend it for lens placement.  A lens was then injected into the capsular bag.  The remaining viscoelastic was aspirated.   Wounds were hydrated with balanced salt  solution.  The anterior chamber was inflated to a physiologic pressure with balanced salt solution.  No wound leaks were noted. Cefuroxime 0.1 ml of a 10mg /ml solution was injected into the anterior chamber for a dose of 1 mg of intracameral antibiotic at the completion of the case.   Timolol and Brimonidine drops were applied to the eye.  The patient was taken to the recovery room in stable condition without complications of anesthesia or surgery.  Mekaela Azizi 09/21/2017, 10:38 AM

## 2017-09-21 NOTE — Transfer of Care (Signed)
Immediate Anesthesia Transfer of Care Note  Patient: Larry Luna  Procedure(s) Performed: CATARACT EXTRACTION PHACO AND INTRAOCULAR LENS PLACEMENT (IOC)  LEFT (Left )  Patient Location: PACU  Anesthesia Type: MAC  Level of Consciousness: awake, alert  and patient cooperative  Airway and Oxygen Therapy: Patient Spontanous Breathing and Patient connected to supplemental oxygen  Post-op Assessment: Post-op Vital signs reviewed, Patient's Cardiovascular Status Stable, Respiratory Function Stable, Patent Airway and No signs of Nausea or vomiting  Post-op Vital Signs: Reviewed and stable  Complications: No apparent anesthesia complications

## 2017-09-21 NOTE — H&P (Signed)
The History and Physical notes are on paper, have been signed, and are to be scanned. The patient remains stable and unchanged from the H&P.   Previous H&P reviewed, patient examined, and there are no changes.  Larry Luna 09/21/2017 10:16 AM

## 2017-09-22 ENCOUNTER — Encounter: Payer: Self-pay | Admitting: Ophthalmology

## 2017-11-01 ENCOUNTER — Emergency Department
Admission: EM | Admit: 2017-11-01 | Discharge: 2017-11-01 | Disposition: A | Discharge status: Home / Self Care | Payer: Medicare HMO | Attending: Emergency Medicine | Admitting: Emergency Medicine

## 2017-11-01 ENCOUNTER — Encounter: Payer: Self-pay | Admitting: Emergency Medicine

## 2017-11-01 ENCOUNTER — Emergency Department: Payer: Medicare HMO

## 2017-11-01 ENCOUNTER — Other Ambulatory Visit: Payer: Self-pay

## 2017-11-01 DIAGNOSIS — N492 Inflammatory disorders of scrotum: Secondary | ICD-10-CM | POA: Diagnosis not present

## 2017-11-01 DIAGNOSIS — Z79899 Other long term (current) drug therapy: Secondary | ICD-10-CM | POA: Insufficient documentation

## 2017-11-01 DIAGNOSIS — N5089 Other specified disorders of the male genital organs: Secondary | ICD-10-CM

## 2017-11-01 DIAGNOSIS — Z7982 Long term (current) use of aspirin: Secondary | ICD-10-CM | POA: Insufficient documentation

## 2017-11-01 DIAGNOSIS — I10 Essential (primary) hypertension: Secondary | ICD-10-CM | POA: Diagnosis not present

## 2017-11-01 DIAGNOSIS — L0291 Cutaneous abscess, unspecified: Secondary | ICD-10-CM

## 2017-11-01 DIAGNOSIS — F17200 Nicotine dependence, unspecified, uncomplicated: Secondary | ICD-10-CM | POA: Diagnosis not present

## 2017-11-01 DIAGNOSIS — N5082 Scrotal pain: Secondary | ICD-10-CM | POA: Diagnosis present

## 2017-11-01 LAB — BASIC METABOLIC PANEL
Anion gap: 10 (ref 5–15)
BUN: 12 mg/dL (ref 6–20)
CALCIUM: 8.9 mg/dL (ref 8.9–10.3)
CHLORIDE: 103 mmol/L (ref 101–111)
CO2: 23 mmol/L (ref 22–32)
CREATININE: 1.27 mg/dL — AB (ref 0.61–1.24)
GFR calc non Af Amer: 60 mL/min — ABNORMAL LOW (ref >60)
Glucose, Bld: 87 mg/dL (ref 65–99)
Potassium: 4.2 mmol/L (ref 3.5–5.1)
SODIUM: 136 mmol/L (ref 135–145)

## 2017-11-01 LAB — CBC WITH DIFFERENTIAL/PLATELET
BASOS PCT: 1 %
Basophils Absolute: 0.1 10*3/uL (ref 0–0.1)
EOS ABS: 0.1 10*3/uL (ref 0–0.7)
EOS PCT: 1 %
HCT: 40.3 % (ref 40.0–52.0)
Hemoglobin: 13.2 g/dL (ref 13.0–18.0)
LYMPHS ABS: 2.5 10*3/uL (ref 1.0–3.6)
Lymphocytes Relative: 20 %
MCH: 25.8 pg — AB (ref 26.0–34.0)
MCHC: 32.8 g/dL (ref 32.0–36.0)
MCV: 78.6 fL — ABNORMAL LOW (ref 80.0–100.0)
MONO ABS: 1 10*3/uL (ref 0.2–1.0)
MONOS PCT: 8 %
Neutro Abs: 8.7 10*3/uL — ABNORMAL HIGH (ref 1.4–6.5)
Neutrophils Relative %: 70 %
PLATELETS: 177 10*3/uL (ref 150–440)
RBC: 5.13 MIL/uL (ref 4.40–5.90)
RDW: 15.8 % — AB (ref 11.5–14.5)
WBC: 12.4 10*3/uL — ABNORMAL HIGH (ref 3.8–10.6)

## 2017-11-01 MED ORDER — IBUPROFEN 600 MG PO TABS: 600.0000 mg | ORAL_TABLET | Freq: Three times a day (TID) | ORAL | 0 refills | Status: DC | PRN

## 2017-11-01 MED ORDER — LIDOCAINE HCL (PF) 1 % IJ SOLN
INTRAMUSCULAR | Status: AC
  Administered 2017-11-01: 14:00:00
  Filled 2017-11-01: qty 5

## 2017-11-01 MED ORDER — MORPHINE SULFATE (PF) 4 MG/ML IV SOLN
4.0000 mg | Freq: Once | INTRAVENOUS | Status: AC
  Administered 2017-11-01: 4 mg via INTRAVENOUS
  Filled 2017-11-01: qty 1

## 2017-11-01 MED ORDER — LIDOCAINE-EPINEPHRINE-TETRACAINE (LET) SOLUTION
NASAL | Status: AC
  Administered 2017-11-01: 14:00:00
  Filled 2017-11-01: qty 3

## 2017-11-01 MED ORDER — OXYCODONE-ACETAMINOPHEN 5-325 MG PO TABS
1.0000 | ORAL_TABLET | ORAL | Status: DC | PRN
  Administered 2017-11-01: 1 via ORAL
  Filled 2017-11-01: qty 1

## 2017-11-01 MED ORDER — OXYCODONE-ACETAMINOPHEN 7.5-325 MG PO TABS: 1.0000 | ORAL_TABLET | ORAL | 0 refills | Status: DC | PRN

## 2017-11-01 MED ORDER — CLINDAMYCIN PHOSPHATE 600 MG/50ML IV SOLN
600.0000 mg | Freq: Once | INTRAVENOUS | Status: AC
  Administered 2017-11-01: 600 mg via INTRAVENOUS
  Filled 2017-11-01: qty 50

## 2017-11-01 MED ORDER — CLINDAMYCIN HCL 150 MG PO CAPS: 150.0000 mg | ORAL_CAPSULE | Freq: Four times a day (QID) | ORAL | 0 refills | Status: DC

## 2017-11-01 NOTE — ED Notes (Signed)
See triage note  Presents with possible abscess area to groin  Hx of same but states area usually "pops" on it's own

## 2017-11-01 NOTE — ED Provider Notes (Signed)
[facility] Emergency Department Provider Note   ____________________________________________   First MD Initiated Contact with Patient 11/01/17 1022     (approximate)  I have reviewed the triage vital signs and the nursing notes.   HISTORY  Chief Complaint Abscess    HPI Larry Luna is a 61 y.o. male patient presents for right scrotal mass which she says increased in the past 2 days. Patient stated area is very painful. Patient has a history of scrotal abscesses treated with antibiotics. No recent therapy. Patient denies urinary complaints.Patient has history of lung cancer.   Past Medical History:  Diagnosis Date  . Arthritis    Knees, Shoulder Right  . Cancer ([place]) 2007   lung  . GERD (gastroesophageal reflux disease)   . Hypertension   . Poorly fitting dentures   . Uses hearing aid    Pt does not wea consistantly    Patient Active Problem List   Diagnosis Date Noted  . Encounter for screening colonoscopy 01/16/2015    Past Surgical History:  Procedure Laterality Date  . CATARACT EXTRACTION W/PHACO Left 09/21/2017   Procedure: CATARACT EXTRACTION PHACO AND INTRAOCULAR LENS PLACEMENT ([place])  LEFT;  Surgeon: Leandrew Koyanagi, MD;  Location: [place];  Service: Ophthalmology;  Laterality: Left;  . LUNG SURGERY Right 10/2006   lung cancer surgery - [facility]  . RIB RESECTION Right 2007    Prior to Admission medications   Medication Sig Start Date End Date Taking? Authorizing Provider  acetaminophen (TYLENOL) 325 MG tablet Take 650 mg by mouth as needed.    [provider]  amLODipine (NORVASC) 10 MG tablet Take 20 mg by mouth daily.    [provider]  aspirin 81 MG tablet Take 81 mg by mouth daily.    [provider]  clindamycin (CLEOCIN) 150 MG capsule Take 1 capsule (150 mg total) by mouth 4 (four) times daily. 11/01/17   Sable Feil, PA-C  cyclobenzaprine (FLEXERIL) 10 MG tablet Take 1  tablet (10 mg total) by mouth 3 (three) times daily as needed for muscle spasms. Patient not taking: Reported on 09/12/2017 08/06/17   Little, Traci M, PA-C  ibuprofen (ADVIL,MOTRIN) 600 MG tablet Take 1 tablet (600 mg total) by mouth every 8 (eight) hours as needed. 11/01/17   Sable Feil, PA-C  oxyCODONE-acetaminophen (PERCOCET) 7.5-325 MG tablet Take 1 tablet by mouth every 4 (four) hours as needed for severe pain. 11/01/17   Sable Feil, PA-C  pantoprazole (PROTONIX) 40 MG tablet Take 40 mg by mouth daily.    [provider]  polyethylene glycol powder (GLYCOLAX/MIRALAX) powder 255 grams one bottle for colonoscopy prep Patient not taking: Reported on 09/12/2017 01/16/15   Robert Bellow, MD  predniSONE (STERAPRED UNI-PAK 21 TAB) 10 MG (21) TBPK tablet Take 6 tablets on day 1. Take 5 tablets on day 2. Take 4 tablets on day 3. Take 3 tablets on day 4. Take 2 tablets on day 5. Take 1 tablets on day 6. Patient not taking: Reported on 09/12/2017 08/06/17   Little, Traci M, PA-C  traMADol (ULTRAM) 50 MG tablet Take 1 tablet (50 mg total) by mouth every 6 (six) hours as needed. Patient not taking: Reported on 09/12/2017 08/06/17 08/06/18  Little, Traci M, PA-C    Allergies Bee venom  Family History  Problem Relation Age of Onset  . Heart attack Father   . Heart disease Mother   . Cancer Sister  breast    Social History Social History   Tobacco Use  . Smoking status: Current Every Day Smoker    Packs/day: 0.25    Years: 45.00    Pack years: 11.25  . Smokeless tobacco: Never Used  Substance Use Topics  . Alcohol use: No    Alcohol/week: 0.0 oz  . Drug use: No    Review of Systems Constitutional: No fever/chills Eyes: No visual changes. ENT: No sore throat. Cardiovascular: Denies chest pain. Respiratory: Denies shortness of breath. Gastrointestinal: No abdominal pain.  No nausea, no vomiting.  No diarrhea.  No constipation. Genitourinary: Negative for  dysuria. Right scrotal mass Musculoskeletal: Negative for back pain. Skin: Negative for rash. Neurological: Negative for headaches, focal weakness or numbness. Endocrine:Hypertension Allergic/Immunilogical: Bee venom  ____________________________________________   PHYSICAL EXAM:  VITAL SIGNS: ED Triage Vitals [11/01/17 0904]  Enc Vitals Group     BP (!) 145/62     Pulse Rate 98     Resp 18     Temp 98.5 F (36.9 C)     Temp Source Oral     SpO2 98 %     Weight 193 lb (87.5 kg)     Height 5\' 3"  (1.6 m)     Head Circumference      Peak Flow      Pain Score 10     Pain Loc      Pain Edu?      Excl. in [place]?     Constitutional: Alert and oriented. Well appearing and in no acute distress. Eyes: Conjunctivae are normal. PERRL. EOMI. Head: Atraumatic. Nose: No congestion/rhinnorhea. Mouth/Throat: Mucous membranes are moist.  Oropharynx non-erythematous. Neck: No stridor.  No cervical spine tenderness to palpation. Hematological/Lymphatic/Immunilogical: No cervical lymphadenopathy. Cardiovascular: Normal rate, regular rhythm. Grossly normal heart sounds.  Good peripheral circulation. Respiratory: Normal respiratory effort.  No retractions. Lungs CTAB. Gastrointestinal: Soft and nontender. No distention. No abdominal bruits. No CVA tenderness. Genitourinary: Right scrotal mass Musculoskeletal: No lower extremity tenderness nor edema.  No joint effusions. Neurologic:  Normal speech and language. No gross focal neurologic deficits are appreciated. No gait instability. Skin:  Skin is warm, dry and intact. No rash noted. Psychiatric: Mood and affect are normal. Speech and behavior are normal.  ____________________________________________   LABS (all labs ordered are listed, but only abnormal results are displayed)  Labs Reviewed  CBC WITH DIFFERENTIAL/PLATELET - Abnormal; Notable for the following components:      Result Value   WBC 12.4 (*)    MCV 78.6 (*)    MCH 25.8 (*)     RDW 15.8 (*)    Neutro Abs 8.7 (*)    All other components within normal limits  BASIC METABOLIC PANEL - Abnormal; Notable for the following components:   Creatinine, Ser 1.27 (*)    GFR calc non Af Amer 60 (*)    All other components within normal limits   ____________________________________________  EKG   ____________________________________________  RADIOLOGY  US Scrotum  Result Date: 11/01/2017 CLINICAL DATA:  Right scrotal mass. EXAM: SCROTAL ULTRASOUND DOPPLER ULTRASOUND OF THE TESTICLES TECHNIQUE: Complete ultrasound examination of the testicles, epididymis, and other scrotal structures was performed. Color and spectral Doppler ultrasound were also utilized to evaluate blood flow to the testicles. COMPARISON:  No recent prior . FINDINGS: Right testicle Measurements: 4.3 x 2.6 x 3.4 cm. No mass or microlithiasis visualized. Left testicle Measurements: 3.7 x 2.3 x 2.9 cm. No mass or microlithiasis visualized. Right epididymis:  2  tiny cysts. Left epididymis:  2 tiny cysts. Hydrocele:  None visualized. Varicocele:  None visualized. Pulsed Doppler interrogation of both testes demonstrates normal low resistance arterial and venous waveforms bilaterally. Noted in the region of soft tissue swelling about the scrotum/perineum are 2 hypoechoic complex fluid collections one measuring 4 cm in maximum diameter the other 1.6 cm. These are consistent with abscesses given the patient's history. Other etiologies including hematomas could also present this fashion. IMPRESSION: Noted in the region of soft tissue swelling about the scrotum and perineum are 2 hypoechoic complex fluid collections,one measuring 4 cm in maximum diameter, the other 1.6 cm in maximum diameter. These are most consistent with abscesses given the patient's history. Electronically Signed   By: Marcello Moores  Register   On: 11/01/2017 11:41   [place] Scrotom Doppler  Result Date: 11/01/2017 CLINICAL DATA:  Right scrotal mass. EXAM:  SCROTAL ULTRASOUND DOPPLER ULTRASOUND OF THE TESTICLES TECHNIQUE: Complete ultrasound examination of the testicles, epididymis, and other scrotal structures was performed. Color and spectral Doppler ultrasound were also utilized to evaluate blood flow to the testicles. COMPARISON:  No recent prior . FINDINGS: Right testicle Measurements: 4.3 x 2.6 x 3.4 cm. No mass or microlithiasis visualized. Left testicle Measurements: 3.7 x 2.3 x 2.9 cm. No mass or microlithiasis visualized. Right epididymis:  2 tiny cysts. Left epididymis:  2 tiny cysts. Hydrocele:  None visualized. Varicocele:  None visualized. Pulsed Doppler interrogation of both testes demonstrates normal low resistance arterial and venous waveforms bilaterally. Noted in the region of soft tissue swelling about the scrotum/perineum are 2 hypoechoic complex fluid collections one measuring 4 cm in maximum diameter the other 1.6 cm. These are consistent with abscesses given the patient's history. Other etiologies including hematomas could also present this fashion. IMPRESSION: Noted in the region of soft tissue swelling about the scrotum and perineum are 2 hypoechoic complex fluid collections,one measuring 4 cm in maximum diameter, the other 1.6 cm in maximum diameter. These are most consistent with abscesses given the patient's history. Electronically Signed   By: Marcello Moores  Register   On: 11/01/2017 11:41    ____________________________________________   PROCEDURES  Procedure(s) performed: None  .Marland KitchenIncision and Drainage Date/Time: 11/01/2017 1:10 PM Performed by: Sable Feil, PA-C Authorized by: Sable Feil, PA-C   Consent:    Risks discussed:  Bleeding   Alternatives discussed:  Alternative treatment, delayed treatment and observation Location:    Location:  Anogenital   Anogenital location:  Scrotal wall Pre-procedure details:    Skin preparation:  Betadine Anesthesia (see MAR for exact dosages):    Anesthesia method:  Topical  application and local infiltration   Topical anesthetic:  LET   Local anesthetic:  Lidocaine 1% w/o epi Procedure type:    Complexity:  Simple Procedure details:    Incision types:  Single with marsupialization   Incision depth:  Dermal   Scalpel blade:  11   Wound management:  Probed and deloculated and irrigated with saline   Drainage:  Purulent   Drainage amount:  Copious   Wound treatment:  Drain placed   Packing materials:  1/4 in gauze    Critical Care performed: No  ____________________________________________   INITIAL IMPRESSION / ASSESSMENT AND PLAN / ED COURSE  As part of my medical decision making, I reviewed the following data within the electronic MEDICAL RECORD NUMBER   Right scrotal wall abscess. Area was I&D. Patient given discharge Instructions and advised return back in 2 days for wound check. Take medication as  directed.      ____________________________________________   FINAL CLINICAL IMPRESSION(S) / ED DIAGNOSES  Final diagnoses:  Abscess     ED Discharge Orders        Ordered    oxyCODONE-acetaminophen (PERCOCET) 7.5-325 MG tablet  Every 4 hours PRN     11/01/17 1305    clindamycin (CLEOCIN) 150 MG capsule  4 times daily     11/01/17 1305    ibuprofen (ADVIL,MOTRIN) 600 MG tablet  Every 8 hours PRN     11/01/17 1305       Note:  This document was prepared using Dragon voice recognition software and may include unintentional dictation errors.    Sable Feil, PA-C 11/01/17 [place], [place], MD 11/04/17 2053

## 2017-11-01 NOTE — ED Triage Notes (Addendum)
Pt to ed with c/o ? Abscess to groin area this am. States area is enlarged and painful

## 2017-11-01 NOTE — Discharge Instructions (Signed)
Following discharge Instructions return back in 2 days for wound check.

## 2017-11-03 ENCOUNTER — Encounter: Payer: Self-pay | Admitting: Emergency Medicine

## 2017-11-03 ENCOUNTER — Emergency Department
Admission: EM | Admit: 2017-11-03 | Discharge: 2017-11-03 | Disposition: A | Discharge status: Home / Self Care | Payer: Medicare HMO | Attending: Student in an Organized Health Care Education/Training Program | Admitting: Student in an Organized Health Care Education/Training Program

## 2017-11-03 ENCOUNTER — Other Ambulatory Visit: Payer: Self-pay

## 2017-11-03 DIAGNOSIS — F17200 Nicotine dependence, unspecified, uncomplicated: Secondary | ICD-10-CM | POA: Diagnosis not present

## 2017-11-03 DIAGNOSIS — Z5189 Encounter for other specified aftercare: Secondary | ICD-10-CM

## 2017-11-03 DIAGNOSIS — N492 Inflammatory disorders of scrotum: Secondary | ICD-10-CM | POA: Insufficient documentation

## 2017-11-03 DIAGNOSIS — Z7982 Long term (current) use of aspirin: Secondary | ICD-10-CM | POA: Diagnosis not present

## 2017-11-03 DIAGNOSIS — Z79899 Other long term (current) drug therapy: Secondary | ICD-10-CM | POA: Insufficient documentation

## 2017-11-03 DIAGNOSIS — Z85118 Personal history of other malignant neoplasm of bronchus and lung: Secondary | ICD-10-CM | POA: Diagnosis not present

## 2017-11-03 NOTE — ED Triage Notes (Signed)
States he is here for wound check  Had area lanced on tuesday

## 2017-11-03 NOTE — ED Provider Notes (Signed)
[facility] Emergency Department Provider Note  ____________________________________________  Time seen: Approximately 3:43 PM  I have reviewed the triage vital signs and the nursing notes.   HISTORY  Chief Complaint Wound Check    HPI Larry Luna is a 61 y.o. male presenting to the emergency department for a wound recheck.  Patient was seen on 11/02/2023 a scrotal abscess.  Patient underwent incision and drainage.  Patient reports that his pain has improved.  He has been taking his clindamycin as directed.  Patient has no concerns.   Past Medical History:  Diagnosis Date  . Arthritis    Knees, Shoulder Right  . Cancer ([place]) 2007   lung  . GERD (gastroesophageal reflux disease)   . Hypertension   . Poorly fitting dentures   . Uses hearing aid    Pt does not wea consistantly    Patient Active Problem List   Diagnosis Date Noted  . Encounter for screening colonoscopy 01/16/2015    Past Surgical History:  Procedure Laterality Date  . CATARACT EXTRACTION W/PHACO Left 09/21/2017   Procedure: CATARACT EXTRACTION PHACO AND INTRAOCULAR LENS PLACEMENT ([place])  LEFT;  Surgeon: Leandrew Koyanagi, MD;  Location: [place] [facility];  Service: Ophthalmology;  Laterality: Left;  . LUNG SURGERY Right 10/2006   lung cancer surgery - [facility]  . RIB RESECTION Right 2007    Prior to Admission medications   Medication Sig Start Date End Date Taking? Authorizing Provider  acetaminophen (TYLENOL) 325 MG tablet Take 650 mg by mouth as needed.    [provider]  amLODipine (NORVASC) 10 MG tablet Take 20 mg by mouth daily.    [provider]  aspirin 81 MG tablet Take 81 mg by mouth daily.    [provider]  clindamycin (CLEOCIN) 150 MG capsule Take 1 capsule (150 mg total) by mouth 4 (four) times daily. 11/01/17   Sable Feil, PA-C  cyclobenzaprine (FLEXERIL) 10 MG tablet Take 1 tablet (10 mg total) by mouth 3 (three) times daily as  needed for muscle spasms. Patient not taking: Reported on 09/12/2017 08/06/17   Little, Traci M, PA-C  ibuprofen (ADVIL,MOTRIN) 600 MG tablet Take 1 tablet (600 mg total) by mouth every 8 (eight) hours as needed. 11/01/17   Sable Feil, PA-C  oxyCODONE-acetaminophen (PERCOCET) 7.5-325 MG tablet Take 1 tablet by mouth every 4 (four) hours as needed for severe pain. 11/01/17   Sable Feil, PA-C  pantoprazole (PROTONIX) 40 MG tablet Take 40 mg by mouth daily.    [provider]  polyethylene glycol powder (GLYCOLAX/MIRALAX) powder 255 grams one bottle for colonoscopy prep Patient not taking: Reported on 09/12/2017 01/16/15   Robert Bellow, MD  predniSONE (STERAPRED UNI-PAK 21 TAB) 10 MG (21) TBPK tablet Take 6 tablets on day 1. Take 5 tablets on day 2. Take 4 tablets on day 3. Take 3 tablets on day 4. Take 2 tablets on day 5. Take 1 tablets on day 6. Patient not taking: Reported on 09/12/2017 08/06/17   Little, Traci M, PA-C  traMADol (ULTRAM) 50 MG tablet Take 1 tablet (50 mg total) by mouth every 6 (six) hours as needed. Patient not taking: Reported on 09/12/2017 08/06/17 08/06/18  Little, Traci M, PA-C    Allergies Bee venom  Family History  Problem Relation Age of Onset  . Heart attack Father   . Heart disease Mother   . Cancer Sister        breast  Social History Social History   Tobacco Use  . Smoking status: Current Every Day Smoker    Packs/day: 0.25    Years: 45.00    Pack years: 11.25  . Smokeless tobacco: Never Used  Substance Use Topics  . Alcohol use: No    Alcohol/week: 0.0 oz  . Drug use: No     Review of Systems  Constitutional: No fever/chills Eyes: No visual changes. No discharge ENT: No upper respiratory complaints. Cardiovascular: no chest pain. Respiratory: no cough. No SOB. Gastrointestinal: No abdominal pain.  No nausea, no vomiting.  No diarrhea.  No constipation. Musculoskeletal: Negative for musculoskeletal pain. Skin: Patient  has resolving scrotal abscess. Neurological: Negative for headaches, focal weakness or numbness.  ____________________________________________   PHYSICAL EXAM:  VITAL SIGNS: ED Triage Vitals  Enc Vitals Group     BP 11/03/17 1457 130/75     Pulse Rate 11/03/17 1457 77     Resp 11/03/17 1457 14     Temp 11/03/17 1457 97.8 F (36.6 C)     Temp Source 11/03/17 1457 Oral     SpO2 11/03/17 1457 96 %     Weight 11/03/17 1452 193 lb (87.5 kg)     Height 11/03/17 1452 5\' 3"  (1.6 m)     Head Circumference --      Peak Flow --      Pain Score --      Pain Loc --      Pain Edu? --      Excl. in [place]? --      Constitutional: Alert and oriented. Well appearing and in no acute distress. Eyes: Conjunctivae are normal. PERRL. EOMI. Head: Atraumatic. Cardiovascular: Normal rate, regular rhythm. Normal S1 and S2.  Good peripheral circulation. Respiratory: Normal respiratory effort without tachypnea or retractions. Lungs CTAB. Good air entry to the bases with no decreased or absent breath sounds. Musculoskeletal: Full range of motion to all extremities. No gross deformities appreciated. Neurologic:  Normal speech and language. No gross focal neurologic deficits are appreciated.  Skin: Patient has resolving scrotal abscess. Psychiatric: Mood and affect are normal. Speech and behavior are normal. Patient exhibits appropriate insight and judgement.   ____________________________________________   LABS (all labs ordered are listed, but only abnormal results are displayed)  Labs Reviewed - No data to display ____________________________________________  EKG   ____________________________________________  RADIOLOGY   No results found.  ____________________________________________    PROCEDURES  Procedure(s) performed:    Procedures    Medications - No data to display   ____________________________________________   INITIAL IMPRESSION / ASSESSMENT AND PLAN / ED  COURSE  Pertinent labs & imaging results that were available during my care of the patient were reviewed by me and considered in my medical decision making (see chart for details).  Review of the [place] CSRS was performed in accordance of the [place] prior to dispensing any controlled drugs.    Assessment and plan Wound check Patient presents to the emergency department for a wound check for a resolving scrotal abscess.  Physical exam was reassuring.  Patient was advised to continue taking his clindamycin as directed.  Vital signs were reassuring prior to discharge.  Patient was advised to return to the emergency department for worsening symptoms.  All patient questions were answered.   ____________________________________________  FINAL CLINICAL IMPRESSION(S) / ED DIAGNOSES  Final diagnoses:  Visit for wound check      NEW MEDICATIONS STARTED DURING THIS VISIT:  ED Discharge Orders    None  This chart was dictated using voice recognition software/Dragon. Despite best efforts to proofread, errors can occur which can change the meaning. Any change was purely unintentional.    Lannie Fields, PA-C 11/03/17 1546    Merlyn Lot, MD 11/04/17 1000

## 2017-11-04 LAB — AEROBIC CULTURE W GRAM STAIN (SUPERFICIAL SPECIMEN): Special Requests: NORMAL

## 2017-11-04 LAB — AEROBIC CULTURE  (SUPERFICIAL SPECIMEN)

## 2017-12-26 ENCOUNTER — Encounter: Payer: Self-pay | Admitting: *Deleted

## 2018-02-16 ENCOUNTER — Ambulatory Visit: Payer: Medicare Other | Admitting: General Surgery

## 2018-03-08 ENCOUNTER — Encounter: Payer: Self-pay | Admitting: *Deleted

## 2018-03-14 ENCOUNTER — Encounter: Payer: Self-pay | Admitting: General Surgery

## 2018-03-14 ENCOUNTER — Ambulatory Visit: Payer: Medicare PPO | Admitting: General Surgery

## 2018-03-14 VITALS — BP 122/74 | HR 78 | Resp 14 | Ht 63.0 in | Wt 187.0 lb

## 2018-03-14 DIAGNOSIS — Z860100 Personal history of colon polyps, unspecified: Secondary | ICD-10-CM | POA: Insufficient documentation

## 2018-03-14 MED ORDER — POLYETHYLENE GLYCOL 3350 17 GM/SCOOP PO POWD: 1.0000 | Freq: Once | ORAL | 0 refills | Status: AC

## 2018-03-14 NOTE — Patient Instructions (Addendum)
Colonoscopy, Adult A colonoscopy is an exam to look at the entire large intestine. During the exam, a lubricated, bendable tube is inserted into the anus and then passed into the rectum, colon, and other parts of the large intestine. A colonoscopy is often done as a part of normal colorectal screening or in response to certain symptoms, such as anemia, persistent diarrhea, abdominal pain, and blood in the stool. The exam can help screen for and diagnose medical problems, including:  Tumors.  Polyps.  Inflammation.  Areas of bleeding.  Tell a health care provider about:  Any allergies you have.  All medicines you are taking, including vitamins, herbs, eye drops, creams, and over-the-counter medicines.  Any problems you or family members have had with anesthetic medicines.  Any blood disorders you have.  Any surgeries you have had.  Any medical conditions you have.  Any problems you have had passing stool. What are the risks? Generally, this is a safe procedure. However, problems may occur, including:  Bleeding.  A tear in the intestine.  A reaction to medicines given during the exam.  Infection (rare).  What happens before the procedure? Eating and drinking restrictions Follow instructions from your health care provider about eating and drinking, which may include:  A few days before the procedure - follow a low-fiber diet. Avoid nuts, seeds, dried fruit, raw fruits, and vegetables.  1-3 days before the procedure - follow a clear liquid diet. Drink only clear liquids, such as clear broth or bouillon, black coffee or tea, clear juice, clear soft drinks or sports drinks, gelatin dessert, and popsicles. Avoid any liquids that contain red or purple dye.  On the day of the procedure - do not eat or drink anything during the 2 hours before the procedure, or within the time period that your health care provider recommends.  Bowel prep If you were prescribed an oral bowel prep  to clean out your colon:  Take it as told by your health care provider. Starting the day before your procedure, you will need to drink a large amount of medicated liquid. The liquid will cause you to have multiple loose stools until your stool is almost clear or light green.  If your skin or anus gets irritated from diarrhea, you may use these to relieve the irritation: ? Medicated wipes, such as adult wet wipes with aloe and vitamin E. ? A skin soothing-product like petroleum jelly.  If you vomit while drinking the bowel prep, take a break for up to 60 minutes and then begin the bowel prep again. If vomiting continues and you cannot take the bowel prep without vomiting, call your health care provider.  General instructions  Ask your health care provider about changing or stopping your regular medicines. This is especially important if you are taking diabetes medicines or blood thinners.  Plan to have someone take you home from the hospital or clinic. What happens during the procedure?  An IV tube may be inserted into one of your veins.  You will be given medicine to help you relax (sedative).  To reduce your risk of infection: ? Your health care team will wash or sanitize their hands. ? Your anal area will be washed with soap.  You will be asked to lie on your side with your knees bent.  Your health care provider will lubricate a long, thin, flexible tube. The tube will have a camera and a light on the end.  The tube will be inserted into your   anus.  The tube will be gently eased through your rectum and colon.  Air will be delivered into your colon to keep it open. You may feel some pressure or cramping.  The camera will be used to take images during the procedure.  A small tissue sample may be removed from your body to be examined under a microscope (biopsy). If any potential problems are found, the tissue will be sent to a lab for testing.  If small polyps are found, your  health care provider may remove them and have them checked for cancer cells.  The tube that was inserted into your anus will be slowly removed. The procedure may vary among health care providers and hospitals. What happens after the procedure?  Your blood pressure, heart rate, breathing rate, and blood oxygen level will be monitored until the medicines you were given have worn off.  Do not drive for 24 hours after the exam.  You may have a small amount of blood in your stool.  You may pass gas and have mild abdominal cramping or bloating due to the air that was used to inflate your colon during the exam.  It is up to you to get the results of your procedure. Ask your health care provider, or the department performing the procedure, when your results will be ready. This information is not intended to replace advice given to you by your health care provider. Make sure you discuss any questions you have with your health care provider. Document Released: 11/05/2000 Document Revised: 09/08/2016 Document Reviewed: 01/20/2016 Elsevier Interactive Patient Education  [employer].  The patient is scheduled for a Colonoscopy at [facility] on 04/05/2018. They are aware to call the day before to get their arrival time. He may continue his 81 mg Aspirin. Miralax prescription has been sent into the patient's pharmacy. The patient is aware of date and instructions.

## 2018-03-14 NOTE — Progress Notes (Signed)
Patient ID: Larry Luna, male   DOB: Feb 20, 1956, 62 y.o.   MRN: [id number]  Chief Complaint  Patient presents with  . Colonoscopy    HPI Larry Luna is a 62 y.o. male.  Who presents for a colonoscopy discussion, history of colon polyps. The last colonoscopy was completed in 2016. Denies any gastrointestinal issues. Bowels move regular and no bleeding noted. Wife, Jackelyn Poling is present at visit.   HPI  Past Medical History:  Diagnosis Date  . Arthritis    Knees, Shoulder Right  . Cancer ([place]) 2007   lung  . GERD (gastroesophageal reflux disease)   . Hypertension   . Poorly fitting dentures   . Uses hearing aid    Pt does not wear consistantly    Past Surgical History:  Procedure Laterality Date  . CATARACT EXTRACTION W/PHACO Left 09/21/2017   Procedure: CATARACT EXTRACTION PHACO AND INTRAOCULAR LENS PLACEMENT ([place])  LEFT;  Surgeon: Leandrew Koyanagi, MD;  Location: [facility];  Service: Ophthalmology;  Laterality: Left;  . COLONOSCOPY  2016  . LUNG SURGERY Right 10/2006   lung cancer surgery - [facility]  . RIB RESECTION Right 2007    Family History  Problem Relation Age of Onset  . Heart attack Father   . Heart disease Mother   . Cancer Sister        breast    Social History Social History   Tobacco Use  . Smoking status: Current Every Day Smoker    Packs/day: 0.25    Years: 45.00    Pack years: 11.25  . Smokeless tobacco: Never Used  Substance Use Topics  . Alcohol use: No    Alcohol/week: 0.0 oz  . Drug use: No    Allergies  Allergen Reactions  . Bee Venom Anaphylaxis    Current Outpatient Medications  Medication Sig Dispense Refill  . acetaminophen (TYLENOL) 325 MG tablet Take 650 mg by mouth as needed.    Marland Kitchen amLODipine (NORVASC) 10 MG tablet Take 20 mg by mouth daily.    Marland Kitchen aspirin 81 MG tablet Take 81 mg by mouth daily.    Marland Kitchen ibuprofen (ADVIL,MOTRIN) 600 MG tablet Take 1 tablet (600 mg total) by mouth every 8 (eight) hours as needed. 15 tablet  0  . pantoprazole (PROTONIX) 40 MG tablet Take 40 mg by mouth daily.    . polyethylene glycol powder (GLYCOLAX/MIRALAX) powder Take 255 g by mouth once for 1 dose. Mix whole container with 64 ounces of clear liquids 255 g 0   No current facility-administered medications for this visit.     Review of Systems Review of Systems  Constitutional: Negative.   Respiratory: Negative.   Cardiovascular: Negative.   Gastrointestinal: Negative.  Negative for blood in stool, diarrhea and nausea.    Blood pressure 122/74, pulse 78, resp. rate 14, height 5\' 3"  (1.6 m), weight 187 lb (84.8 kg), SpO2 98 %.  Physical Exam Physical Exam  Constitutional: He is oriented to person, place, and time. He appears well-developed and well-nourished.  Cardiovascular: Normal rate, regular rhythm and normal heart sounds.  Pulmonary/Chest: Effort normal and breath sounds normal.      Neurological: He is alert and oriented to person, place, and time.  Skin: Skin is warm and dry.    Data Reviewed February 12, 2015.  DIAGNOSIS:  A. COLON, PROXIMAL TRANSVERSE POLYPS; MUCOSAL BIOPSIES AND ENDOSCOPIC  POLYPECTOMY:  - FRAGMENTS OF TUBULAR ADENOMA(S) AND LYMPHOID POLYP(S).  - NO HIGH GRADE DYSPLASIA OR MALIGNANCY SEEN.  B. COLON, DESCENDING POLYPS; ENDOSCOPIC POLYPECTOMY:  - FRAGMENTS OF TUBULAR ADENOMA(S) AND COLONIC MUCOSA SHOWING FOCAL  MUCOSAL HYPERPLASIA.  - NO HIGH GRADE DYSPLASIA OR MALIGNANCY SEEN.   C. COLON, SIGMOID; MUCOSAL BIOPSIES AND ENDOSCOPIC POLYPECTOMY:  - FRAGMENTS OF TUBULAR ADENOMA AND UNREMARKABLE COLONIC MUCOSA.  - NO HIGH GRADE DYSPLASIA OR MALIGNANCY SEEN.   D. RECTUM, POLYP; MUCOSAL BIOPSY:  - TUBULAR ADENOMA.  - NO HIGH GRADE DYSPLASIA OR MALIGNANCY SEEN.   Assessment    February 12, 2015 colonoscopy results:  DIAGNOSIS:  A. COLON, PROXIMAL TRANSVERSE POLYPS; MUCOSAL BIOPSIES AND ENDOSCOPIC  POLYPECTOMY:  - FRAGMENTS OF TUBULAR ADENOMA(S) AND LYMPHOID POLYP(S).  - NO  HIGH GRADE DYSPLASIA OR MALIGNANCY SEEN.   B. COLON, DESCENDING POLYPS; ENDOSCOPIC POLYPECTOMY:  - FRAGMENTS OF TUBULAR ADENOMA(S) AND COLONIC MUCOSA SHOWING FOCAL  MUCOSAL HYPERPLASIA.  - NO HIGH GRADE DYSPLASIA OR MALIGNANCY SEEN.   C. COLON, SIGMOID; MUCOSAL BIOPSIES AND ENDOSCOPIC POLYPECTOMY:  - FRAGMENTS OF TUBULAR ADENOMA AND UNREMARKABLE COLONIC MUCOSA.  - NO HIGH GRADE DYSPLASIA OR MALIGNANCY SEEN.   D. RECTUM, POLYP; MUCOSAL BIOPSY:  - TUBULAR ADENOMA.  - NO HIGH GRADE DYSPLASIA OR MALIGNANCY SEEN.     Plan  Colonoscopy with possible biopsy/polypectomy prn: Information regarding the procedure, including its potential risks and complications (including but not limited to perforation of the bowel, which may require emergency surgery to repair, and bleeding) was verbally given to the patient. Educational information regarding lower intestinal endoscopy was given to the patient. Written instructions for how to complete the bowel prep using Miralax were provided. The importance of drinking ample fluids to avoid dehydration as a result of the prep emphasized.  HPI, Physical Exam, Assessment and Plan have been scribed under the direction and in the presence of Hervey Ard, MD. Gaspar Cola, CMA   I have completed the exam and reviewed the above documentation for accuracy and completeness.  I agree with the above.  Haematologist has been used and any errors in dictation or transcription are unintentional.  Hervey Ard, M.D., F.A.C.S.  The patient is scheduled for a Colonoscopy at [facility] on 04/05/2018. They are aware to call the day before to get their arrival time. He may continue his 81 mg Aspirin. Miralax prescription has been sent into the patient's pharmacy. The patient is aware of date and instructions.  Documented by Caryl-Lyn Otis Brace LPN   Larry Luna 03/14/2018, 8:47 PM

## 2018-04-05 ENCOUNTER — Encounter
Admission: RE | Disposition: A | Discharge status: Home / Self Care | Payer: Self-pay | Source: Ambulatory Visit | Attending: General Surgery

## 2018-04-05 ENCOUNTER — Encounter: Payer: Self-pay | Admitting: *Deleted

## 2018-04-05 ENCOUNTER — Ambulatory Visit: Payer: Medicare PPO | Admitting: Anesthesiology

## 2018-04-05 ENCOUNTER — Ambulatory Visit
Admission: RE | Admit: 2018-04-05 | Discharge: 2018-04-05 | Disposition: A | Discharge status: Home / Self Care | Payer: Medicare PPO | Source: Ambulatory Visit | Attending: General Surgery | Admitting: General Surgery

## 2018-04-05 DIAGNOSIS — Z6834 Body mass index (BMI) 34.0-34.9, adult: Secondary | ICD-10-CM | POA: Insufficient documentation

## 2018-04-05 DIAGNOSIS — F17200 Nicotine dependence, unspecified, uncomplicated: Secondary | ICD-10-CM | POA: Insufficient documentation

## 2018-04-05 DIAGNOSIS — Z86010 Personal history of colonic polyps: Secondary | ICD-10-CM | POA: Insufficient documentation

## 2018-04-05 DIAGNOSIS — K219 Gastro-esophageal reflux disease without esophagitis: Secondary | ICD-10-CM | POA: Insufficient documentation

## 2018-04-05 DIAGNOSIS — Z79899 Other long term (current) drug therapy: Secondary | ICD-10-CM | POA: Diagnosis not present

## 2018-04-05 DIAGNOSIS — Z1211 Encounter for screening for malignant neoplasm of colon: Secondary | ICD-10-CM | POA: Insufficient documentation

## 2018-04-05 DIAGNOSIS — Z7982 Long term (current) use of aspirin: Secondary | ICD-10-CM | POA: Diagnosis not present

## 2018-04-05 DIAGNOSIS — D170 Benign lipomatous neoplasm of skin and subcutaneous tissue of head, face and neck: Secondary | ICD-10-CM | POA: Diagnosis not present

## 2018-04-05 DIAGNOSIS — I10 Essential (primary) hypertension: Secondary | ICD-10-CM | POA: Diagnosis not present

## 2018-04-05 DIAGNOSIS — D123 Benign neoplasm of transverse colon: Secondary | ICD-10-CM | POA: Diagnosis not present

## 2018-04-05 HISTORY — PX: COLONOSCOPY WITH PROPOFOL: SHX5780

## 2018-04-05 SURGERY — COLONOSCOPY WITH PROPOFOL
Anesthesia: General

## 2018-04-05 MED ORDER — PROPOFOL 500 MG/50ML IV EMUL
INTRAVENOUS | Status: DC | PRN
  Administered 2018-04-05: 150 ug/kg/min via INTRAVENOUS

## 2018-04-05 MED ORDER — PROPOFOL 10 MG/ML IV BOLUS
INTRAVENOUS | Status: DC | PRN
  Administered 2018-04-05: 20 mg via INTRAVENOUS
  Administered 2018-04-05: 50 mg via INTRAVENOUS

## 2018-04-05 MED ORDER — PROPOFOL 500 MG/50ML IV EMUL
INTRAVENOUS | Status: AC
  Filled 2018-04-05: qty 50

## 2018-04-05 MED ORDER — LIDOCAINE HCL (CARDIAC) PF 100 MG/5ML IV SOSY
PREFILLED_SYRINGE | INTRAVENOUS | Status: DC | PRN
  Administered 2018-04-05: 50 mg via INTRAVENOUS

## 2018-04-05 MED ORDER — SODIUM CHLORIDE 0.9 % IV SOLN
INTRAVENOUS | Status: DC
  Administered 2018-04-05: 1000 mL via INTRAVENOUS

## 2018-04-05 MED ORDER — PHENYLEPHRINE HCL 10 MG/ML IJ SOLN
INTRAMUSCULAR | Status: DC | PRN
  Administered 2018-04-05: 100 ug via INTRAVENOUS

## 2018-04-05 NOTE — H&P (Signed)
Multiple polyps at the time of the 2016 colonoscopy. For f/u exam.  Tolerated prep well.   No change in clinical history or exam.

## 2018-04-05 NOTE — Transfer of Care (Signed)
Immediate Anesthesia Transfer of Care Note  Patient: Read A Mandrell  Procedure(s) Performed: COLONOSCOPY WITH PROPOFOL (N/A )  Patient Location: PACU  Anesthesia Type:General  Level of Consciousness: awake, alert  and oriented  Airway & Oxygen Therapy: Patient Spontanous Breathing and Patient connected to nasal cannula oxygen  Post-op Assessment: Report given to RN and Post -op Vital signs reviewed and stable  Post vital signs: Reviewed and stable  Last Vitals:  Vitals Value Taken Time  BP 103/69 04/05/2018  9:05 AM  Temp 36.1 C 04/05/2018  9:05 AM  Pulse 74 04/05/2018  9:05 AM  Resp 21 04/05/2018  9:05 AM  SpO2 98 % 04/05/2018  9:05 AM  Vitals shown include unvalidated device data.  Last Pain:  Vitals:   04/05/18 0904  TempSrc:   PainSc: 0-No pain         Complications: No apparent anesthesia complications

## 2018-04-05 NOTE — Op Note (Signed)
[facility] Patient Name: Larry Luna Procedure Date: 04/05/2018 8:25 AM MRN: [id number] Account #: [id number] Date of Birth: 04/25/1956 Admit Type: Outpatient Age: 62 Room: [facility] ENDO ROOM 1 Gender: Male Note Status: Finalized Procedure:            Colonoscopy Indications:          High risk colon cancer surveillance: Personal history                        of colonic polyps Providers:            Robert Bellow, MD Medicines:            Monitored Anesthesia Care Complications:        No immediate complications. Procedure:            Pre-Anesthesia Assessment:                       - Prior to the procedure, a History and Physical was                        performed, and patient medications, allergies and                        sensitivities were reviewed. The patient's tolerance of                        previous anesthesia was reviewed.                       - The risks and benefits of the procedure and the                        sedation options and risks were discussed with the                        patient. All questions were answered and informed                        consent was obtained.                       After obtaining informed consent, the colonoscope was                        passed under direct vision. Throughout the procedure,                        the patient's blood pressure, pulse, and oxygen                        saturations were monitored continuously. The                        Colonoscope was introduced through the anus and                        advanced to the the cecum, identified by appendiceal                        orifice and ileocecal valve. The colonoscopy was  performed without difficulty. The patient tolerated the                        procedure well. The quality of the bowel preparation                        was excellent. Findings:      Three sessile polyps were found in the  hepatic flexure. The polyps were       5 to 10 mm in size. These were biopsied with a cold large-capacity       forceps for histology.      A 8 mm polyp was found in the distal transverse colon. The polyp was       sessile. Biopsies were taken with a cold forceps for histology.      The retroflexed view of the distal rectum and anal verge was normal and       showed no anal or rectal abnormalities. Impression:           - Three 5 to 10 mm polyps at the hepatic flexure.                        Biopsied.                       - One 8 mm polyp in the distal transverse colon.                        Biopsied.                       - The distal rectum and anal verge are normal on                        retroflexion view. Recommendation:       - Telephone endoscopist for pathology results in 1 week. Procedure Code(s):    --- Professional ---                       [phone], Colonoscopy, flexible; with biopsy, single or                        multiple Diagnosis Code(s):    --- Professional ---                       D12.3, Benign neoplasm of transverse colon (hepatic                        flexure or splenic flexure)                       Z86.010, Personal history of colonic polyps CPT copyright 2017 American Medical Association. All rights reserved. The codes documented in this report are preliminary and upon coder review may  be revised to meet current compliance requirements. Robert Bellow, MD 04/05/2018 9:00:34 AM This report has been signed electronically. Number of Addenda: 0 Note Initiated On: 04/05/2018 8:25 AM Scope Withdrawal Time: 0 hours 12 minutes 29 seconds  Total Procedure Duration: 0 hours 21 minutes 1 second       [facility]

## 2018-04-05 NOTE — Anesthesia Postprocedure Evaluation (Signed)
Anesthesia Post Note  Patient: Larry Luna  Procedure(s) Performed: COLONOSCOPY WITH PROPOFOL (N/A )  Patient location during evaluation: Endoscopy Anesthesia Type: General Level of consciousness: awake and alert and oriented Pain management: pain level controlled Vital Signs Assessment: post-procedure vital signs reviewed and stable Respiratory status: spontaneous breathing Cardiovascular status: blood pressure returned to baseline Anesthetic complications: no     Last Vitals:  Vitals:   04/05/18 0905 04/05/18 0947  BP: 103/69 122/70  Pulse: 77 70  Resp: 10 18  Temp: (!) 36.1 C   SpO2: 99% 99%    Last Pain:  Vitals:   04/05/18 0947  TempSrc:   PainSc: 0-No pain                 Hallis Meditz

## 2018-04-05 NOTE — Anesthesia Preprocedure Evaluation (Signed)
Anesthesia Evaluation  Patient identified by MRN, date of birth, ID band Patient awake    Reviewed: Allergy & Precautions, NPO status   Airway Mallampati: II  TM Distance: >3 FB     Dental  (+) Missing   Pulmonary Current Smoker,    breath sounds clear to auscultation       Cardiovascular hypertension,  Rhythm:Regular Rate:Normal     Neuro/Psych negative neurological ROS  negative psych ROS   GI/Hepatic GERD  Controlled,  Endo/Other  negative endocrine ROSMorbid obesity (BMI 35)  Renal/GU      Musculoskeletal  (+) Arthritis ,   Abdominal   Peds  Hematology   Anesthesia Other Findings Past Medical History: No date: Arthritis     Comment:  Knees, Shoulder Right 2007: Cancer ([place])     Comment:  lung No date: GERD (gastroesophageal reflux disease) No date: Hypertension No date: Poorly fitting dentures No date: Uses hearing aid     Comment:  Pt does not wear consistantly  Reproductive/Obstetrics                             Anesthesia Physical  Anesthesia Plan  ASA: II  Anesthesia Plan: General   Post-op Pain Management:    Induction: Intravenous  PONV Risk Score and Plan:   Airway Management Planned: Nasal Cannula  Additional Equipment:   Intra-op Plan:   Post-operative Plan:   Informed Consent: I have reviewed the patients History and Physical, chart, labs and discussed the procedure including the risks, benefits and alternatives for the proposed anesthesia with the patient or authorized representative who has indicated his/her understanding and acceptance.     Plan Discussed with: CRNA  Anesthesia Plan Comments:         Anesthesia Quick Evaluation

## 2018-04-05 NOTE — Anesthesia Post-op Follow-up Note (Signed)
Anesthesia QCDR form completed.        

## 2018-04-07 LAB — SURGICAL PATHOLOGY

## 2018-04-12 ENCOUNTER — Telehealth: Payer: Self-pay

## 2018-04-12 NOTE — Telephone Encounter (Signed)
-----   Message from Robert Bellow, MD sent at 04/12/2018  7:17 AM EDT ----- Please noitfy the patient polyps OK. He should plan on a repeat exam in five years.  Please place in recalls. Thanks.  ----- Message ----- From: Interface, Lab In Three Zero One Sent: 04/07/2018   6:00 PM To: Robert Bellow, MD

## 2018-04-20 NOTE — Telephone Encounter (Signed)
Notified patient as instructed, patient pleased. Discussed follow-up appointments, patient agrees. Patient placed in recalls.   

## 2019-04-15 ENCOUNTER — Other Ambulatory Visit: Payer: Self-pay

## 2019-04-15 ENCOUNTER — Encounter: Payer: Self-pay | Admitting: Emergency Medicine

## 2019-04-15 ENCOUNTER — Emergency Department: Payer: Medicare Other

## 2019-04-15 ENCOUNTER — Emergency Department
Admission: EM | Admit: 2019-04-15 | Discharge: 2019-04-15 | Disposition: A | Discharge status: Home / Self Care | Payer: Medicare Other | Attending: Emergency Medicine | Admitting: Emergency Medicine

## 2019-04-15 DIAGNOSIS — I10 Essential (primary) hypertension: Secondary | ICD-10-CM | POA: Insufficient documentation

## 2019-04-15 DIAGNOSIS — S59902A Unspecified injury of left elbow, initial encounter: Secondary | ICD-10-CM | POA: Diagnosis present

## 2019-04-15 DIAGNOSIS — Y999 Unspecified external cause status: Secondary | ICD-10-CM | POA: Diagnosis not present

## 2019-04-15 DIAGNOSIS — W3400XA Accidental discharge from unspecified firearms or gun, initial encounter: Secondary | ICD-10-CM | POA: Insufficient documentation

## 2019-04-15 DIAGNOSIS — F17200 Nicotine dependence, unspecified, uncomplicated: Secondary | ICD-10-CM | POA: Diagnosis not present

## 2019-04-15 DIAGNOSIS — Z85118 Personal history of other malignant neoplasm of bronchus and lung: Secondary | ICD-10-CM | POA: Diagnosis not present

## 2019-04-15 DIAGNOSIS — Y939 Activity, unspecified: Secondary | ICD-10-CM | POA: Diagnosis not present

## 2019-04-15 DIAGNOSIS — Y92008 Other place in unspecified non-institutional (private) residence as the place of occurrence of the external cause: Secondary | ICD-10-CM | POA: Insufficient documentation

## 2019-04-15 DIAGNOSIS — S51012A Laceration without foreign body of left elbow, initial encounter: Secondary | ICD-10-CM | POA: Diagnosis not present

## 2019-04-15 DIAGNOSIS — S51032A Puncture wound without foreign body of left elbow, initial encounter: Secondary | ICD-10-CM

## 2019-04-15 MED ORDER — TETANUS-DIPHTH-ACELL PERTUSSIS 5-2.5-18.5 LF-MCG/0.5 IM SUSP
0.5000 mL | Freq: Once | INTRAMUSCULAR | Status: AC
  Administered 2019-04-15: 0.5 mL via INTRAMUSCULAR
  Filled 2019-04-15: qty 0.5

## 2019-04-15 MED ORDER — LIDOCAINE HCL (PF) 1 % IJ SOLN
5.0000 mL | Freq: Once | INTRAMUSCULAR | Status: AC
  Administered 2019-04-15: 5 mL via INTRADERMAL

## 2019-04-15 MED ORDER — OXYCODONE-ACETAMINOPHEN 5-325 MG PO TABS: 1.0000 | ORAL_TABLET | Freq: Three times a day (TID) | ORAL | 0 refills | Status: DC | PRN

## 2019-04-15 MED ORDER — LIDOCAINE HCL (PF) 1 % IJ SOLN
INTRAMUSCULAR | Status: AC
  Administered 2019-04-15: 5 mL via INTRADERMAL
  Filled 2019-04-15: qty 5

## 2019-04-15 MED ORDER — CEPHALEXIN 500 MG PO CAPS: 500.0000 mg | ORAL_CAPSULE | Freq: Four times a day (QID) | ORAL | 0 refills | Status: AC

## 2019-04-15 MED ORDER — OXYCODONE-ACETAMINOPHEN 5-325 MG PO TABS
2.0000 | ORAL_TABLET | Freq: Once | ORAL | Status: AC
  Administered 2019-04-15: 22:00:00 2 via ORAL
  Filled 2019-04-15: qty 2

## 2019-04-15 NOTE — ED Notes (Signed)
Pt given acuity of four to to pt being stable and uncomplicated.
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
## 2019-04-22 ENCOUNTER — Other Ambulatory Visit: Payer: Self-pay

## 2019-04-22 ENCOUNTER — Emergency Department
Admission: EM | Admit: 2019-04-22 | Discharge: 2019-04-22 | Disposition: A | Discharge status: Home / Self Care | Payer: Medicare Other | Attending: Emergency Medicine | Admitting: Emergency Medicine

## 2019-04-22 DIAGNOSIS — F17210 Nicotine dependence, cigarettes, uncomplicated: Secondary | ICD-10-CM | POA: Insufficient documentation

## 2019-04-22 DIAGNOSIS — I10 Essential (primary) hypertension: Secondary | ICD-10-CM | POA: Diagnosis not present

## 2019-04-22 DIAGNOSIS — Z4802 Encounter for removal of sutures: Secondary | ICD-10-CM | POA: Diagnosis not present

## 2019-04-22 NOTE — ED Triage Notes (Signed)
Pt here for suture removal from left arm

## 2019-04-22 NOTE — ED Notes (Signed)
3 sutures removed by this RN

## 2019-04-22 NOTE — ED Provider Notes (Signed)
[facility] Emergency Department Provider Note  ____________________________________________   First MD Initiated Contact with Patient 04/22/19 1021     (approximate)  I have reviewed the triage vital signs and the nursing notes.   HISTORY  Chief Complaint Suture / Staple Removal    HPI Larry Luna is a 63 y.o. male patient is here for suture removal.  No problems with the sutured area.  No signs of infection.  He denies any fever chills or drainage from site.    Past Medical History:  Diagnosis Date  . Arthritis    Knees, Shoulder Right  . Cancer ([place]) 2007   lung  . GERD (gastroesophageal reflux disease)   . Hypertension   . Poorly fitting dentures   . Uses hearing aid    Pt does not wear consistantly    Patient Active Problem List   Diagnosis Date Noted  . History of colonic polyps 03/14/2018  . Encounter for screening colonoscopy 01/16/2015    Past Surgical History:  Procedure Laterality Date  . CATARACT EXTRACTION W/PHACO Left 09/21/2017   Procedure: CATARACT EXTRACTION PHACO AND INTRAOCULAR LENS PLACEMENT ([place])  LEFT;  Surgeon: Leandrew Koyanagi, MD;  Location: [place];  Service: Ophthalmology;  Laterality: Left;  . COLONOSCOPY  2016  . COLONOSCOPY WITH PROPOFOL N/A 04/05/2018   Procedure: COLONOSCOPY WITH PROPOFOL;  Surgeon: Robert Bellow, MD;  Location: [facility] ENDOSCOPY;  Service: Endoscopy;  Laterality: N/A;  . EYE SURGERY    . LUNG SURGERY Right 10/2006   lung cancer surgery - [facility]  . RIB RESECTION Right 2007    Prior to Admission medications   Medication Sig Start Date End Date Taking? Authorizing Provider  acetaminophen (TYLENOL) 325 MG tablet Take 650 mg by mouth as needed.    [provider]  amLODipine (NORVASC) 10 MG tablet Take 20 mg by mouth daily.    [provider]  aspirin 81 MG tablet Take 81 mg by mouth daily.    [provider]  cephALEXin (KEFLEX) 500 MG capsule  Take 1 capsule (500 mg total) by mouth 4 (four) times daily for 10 days. 04/15/19 04/25/19  Earleen Newport, MD  ibuprofen (ADVIL,MOTRIN) 600 MG tablet Take 1 tablet (600 mg total) by mouth every 8 (eight) hours as needed. 11/01/17   Sable Feil, PA-C  oxyCODONE-acetaminophen (PERCOCET) 5-325 MG tablet Take 1 tablet by mouth every 8 (eight) hours as needed. 04/15/19   Earleen Newport, MD  pantoprazole (PROTONIX) 40 MG tablet Take 40 mg by mouth daily.    [provider]    Allergies Bee venom  Family History  Problem Relation Age of Onset  . Heart attack Father   . Heart disease Mother   . Cancer Sister        breast    Social History Social History   Tobacco Use  . Smoking status: Current Some Day Smoker    Packs/day: 0.25    Years: 45.00    Pack years: 11.25  . Smokeless tobacco: Never Used  Substance Use Topics  . Alcohol use: No    Alcohol/week: 0.0 standard drinks  . Drug use: No    Review of Systems  Constitutional: No fever/chills here for suture removal Eyes: No visual changes. ENT: No sore throat. Respiratory: Denies cough Genitourinary: Negative for dysuria. Musculoskeletal: Negative for back pain. Skin: Negative for rash.    ____________________________________________   PHYSICAL EXAM:  VITAL SIGNS: ED Triage Vitals  Enc  Vitals Group     BP 04/22/19 0959 (!) 150/86     Pulse Rate 04/22/19 0959 65     Resp 04/22/19 0959 18     Temp 04/22/19 0959 98.5 F (36.9 C)     Temp Source 04/22/19 0959 Oral     SpO2 04/22/19 0959 100 %     Weight 04/22/19 0956 200 lb (90.7 kg)     Height 04/22/19 0956 5\' 3"  (1.6 m)     Head Circumference --      Peak Flow --      Pain Score 04/22/19 0956 0     Pain Loc --      Pain Edu? --      Excl. in [place]? --     Constitutional: Alert and oriented. Well appearing and in no acute distress. Eyes: Conjunctivae are normal.  Head: Atraumatic. Nose: No congestion/rhinnorhea. Mouth/Throat: Mucous  membranes are moist.   Cardiovascular: Normal rate, regular rhythm. Respiratory: Normal respiratory effort.  No retractions GU: deferred Musculoskeletal: FROM all extremities, warm and well perfused Neurologic:  Normal speech and language.  Skin:  Skin is warm, dry and intact. No rash noted.  Sutured area is well approximated.  No redness pus or swelling is noted. Psychiatric: Mood and affect are normal. Speech and behavior are normal.  ____________________________________________   LABS (all labs ordered are listed, but only abnormal results are displayed)  Labs Reviewed - No data to display ____________________________________________   ____________________________________________  RADIOLOGY    ____________________________________________   PROCEDURES  Procedure(s) performed: Sutures removed by nursing staff   Procedures    ____________________________________________   INITIAL IMPRESSION / ASSESSMENT AND PLAN / ED COURSE  Pertinent labs & imaging results that were available during my care of the patient were reviewed by me and considered in my medical decision making (see chart for details).   Patient 63 year old male presents emergency department with need of suture removal.  Physical exam shows the sutured area to be well approximated, no pus or drainage or redness is noted.  Sutures removed by nursing staff.  Patient is discharged stable condition.     As part of my medical decision making, I reviewed the following data within the [place] notes reviewed and incorporated, Old chart reviewed, Notes from prior ED visits and [place] Controlled Substance Database  ____________________________________________   FINAL CLINICAL IMPRESSION(S) / ED DIAGNOSES  Final diagnoses:  Encounter for removal of sutures      NEW MEDICATIONS STARTED DURING THIS VISIT:  New Prescriptions   No medications on file     Note:  This document was  prepared using Dragon voice recognition software and may include unintentional dictation errors.    Versie Starks, PA-C 04/22/19 1025    Nena Polio, MD 04/22/19 [phone]

## 2019-04-22 NOTE — ED Notes (Signed)
Pt verbalized understanding of discharge instructions. NAD at this time. 

## 2020-03-29 ENCOUNTER — Ambulatory Visit: Payer: Medicare Other | Attending: Internal Medicine
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
## 2020-07-09 DIAGNOSIS — M1711 Unilateral primary osteoarthritis, right knee: Secondary | ICD-10-CM | POA: Insufficient documentation

## 2020-07-31 ENCOUNTER — Other Ambulatory Visit: Payer: Self-pay | Admitting: Radiology

## 2020-07-31 ENCOUNTER — Encounter: Payer: Self-pay | Admitting: Urology

## 2020-07-31 ENCOUNTER — Other Ambulatory Visit: Payer: Self-pay

## 2020-07-31 ENCOUNTER — Ambulatory Visit: Payer: Medicare Other | Admitting: Urology

## 2020-07-31 VITALS — BP 202/91 | HR 65 | Ht 62.9 in | Wt 201.7 lb

## 2020-07-31 DIAGNOSIS — N492 Inflammatory disorders of scrotum: Secondary | ICD-10-CM

## 2020-08-01 ENCOUNTER — Other Ambulatory Visit
Admission: RE | Admit: 2020-08-01 | Discharge: 2020-08-01 | Disposition: A | Discharge status: Home / Self Care | Payer: Medicare Other | Source: Ambulatory Visit | Attending: Urology | Admitting: Urology

## 2020-08-01 ENCOUNTER — Other Ambulatory Visit: Payer: Medicare Other

## 2020-08-01 ENCOUNTER — Encounter: Payer: Self-pay | Admitting: Urology

## 2020-08-01 DIAGNOSIS — Z20822 Contact with and (suspected) exposure to covid-19: Secondary | ICD-10-CM | POA: Insufficient documentation

## 2020-08-01 DIAGNOSIS — Z01812 Encounter for preprocedural laboratory examination: Secondary | ICD-10-CM | POA: Insufficient documentation

## 2020-08-01 NOTE — H&P (View-Only) (Signed)
07/31/2020 5:49 PM   Larry Luna June 05, 1956 [id number]  Referring provider: Theotis Burrow, MD [address] [place],  [place] [address]  Chief Complaint  Patient presents with   abcess scrotal    HPI: Larry Luna is a 64 y.o. male seen at the request of Dr. Alene Mires for evaluation of a scrotal abscess.   Complains of a 4-month history of pain and swelling of the right hemiscrotum  Has noted a small amount of drainage the past 2-3 days  Denies fever, chills  Saw PCP last week and urology referral made  No bothersome LUTS  Seen in the ED December 2018 for a right hemiscrotal abscess and underwent I&D in the ED  No bowel symptoms  No history of perirectal/perianal abscess   PMH: Past Medical History:  Diagnosis Date   Arthritis    Knees, Shoulder Right   Cancer ([place]) 2007   lung   GERD (gastroesophageal reflux disease)    Hypertension    Poorly fitting dentures    Uses hearing aid    Pt does not wear consistantly    Surgical History: Past Surgical History:  Procedure Laterality Date   CATARACT EXTRACTION W/PHACO Left 09/21/2017   Procedure: CATARACT EXTRACTION PHACO AND INTRAOCULAR LENS PLACEMENT ([place])  LEFT;  Surgeon: Leandrew Koyanagi, MD;  Location: [place];  Service: Ophthalmology;  Laterality: Left;   COLONOSCOPY  2016   COLONOSCOPY WITH PROPOFOL N/A 04/05/2018   Procedure: COLONOSCOPY WITH PROPOFOL;  Surgeon: Robert Bellow, MD;  Location: [facility];  Service: Endoscopy;  Laterality: N/A;   EYE SURGERY     LUNG SURGERY Right 10/2006   lung cancer surgery - [facility]   RIB RESECTION Right 2007    Home Medications:  Allergies as of 07/31/2020      Reactions   Bee Venom Anaphylaxis      Medication List       Accurate as of July 31, 2020 11:59 PM. If you have any questions, ask your nurse or doctor.        STOP taking these medications   ibuprofen 600 MG tablet Commonly known as:  ADVIL Stopped by: Abbie Sons, MD   oxyCODONE-acetaminophen 5-325 MG tablet Commonly known as: Percocet Stopped by: Abbie Sons, MD     TAKE these medications   acetaminophen 500 MG tablet Commonly known as: TYLENOL Take 1,000 mg by mouth daily as needed for mild pain or moderate pain.   aspirin 81 MG tablet Take 81 mg by mouth daily.       Allergies:  Allergies  Allergen Reactions   Bee Venom Anaphylaxis    Family History: Family History  Problem Relation Age of Onset   Heart attack Father    Heart disease Mother    Cancer Sister        breast    Social History:  reports that he has been smoking. He has a 11.25 pack-year smoking history. He has never used smokeless tobacco. He reports that he does not drink alcohol and does not use drugs.   Physical Exam: BP (!) 202/91 (BP Location: Left Arm, Patient Position: Sitting, Cuff Size: Large)    Pulse 65    Ht 5' 2.9" (1.598 m)    Wt 201 lb 11.2 oz (91.5 kg)    BMI 35.84 kg/m   Constitutional:  Alert and oriented, No acute distress. HEENT: [place] AT, moist mucus membranes.  Trachea midline, no masses. Cardiovascular: No clubbing, cyanosis, or  edema.  RRR Respiratory: Normal respiratory effort, no increased work of breathing.  Clear GI: Abdomen is soft, nontender, nondistended, no abdominal masses GU: Phallus without lesions, testes descended bilaterally without masses or tenderness on the posterior right hemiscrotum near the perineal junction is a fluctuant mass measuring ~2 x 3 cm.  Minimal drainage noted, minimal tenderness.  Some induration of the perineum though no fluctuance Skin: No rashes, bruises or suspicious lesions. Neurologic: Grossly intact, no focal deficits, moving all 4 extremities. Psychiatric: Normal mood and affect.   Assessment & Plan:    1.  Right scrotal wall abscess  Findings were discussed in detail  Recommend I&D under anesthesia/sedation  The procedure was discussed in detail  clear potential risks of bleeding, infection  I recommended to schedule this tomorrow however he stated he would be unable to do until early next week.  It was recommended he proceed to the ED for development of fever or worsening pain.    Abbie Sons, [place] [address], [place], [place] [address] [phone]

## 2020-08-01 NOTE — Progress Notes (Signed)
07/31/2020 5:49 PM   Larry Luna February 26, 1956 [id number]  Referring provider: Theotis Burrow, MD [address] [place],  [place] [address]  Chief Complaint  Patient presents with  . abcess scrotal    HPI: Larry Luna is a 64 y.o. male seen at the request of Dr. Alene Mires for evaluation of a scrotal abscess.   Complains of a 70-month history of pain and swelling of the right hemiscrotum  Has noted a small amount of drainage the past 2-3 days  Denies fever, chills  Saw PCP last week and urology referral made  No bothersome LUTS  Seen in the ED December 2018 for a right hemiscrotal abscess and underwent I&D in the ED  No bowel symptoms  No history of perirectal/perianal abscess   PMH: Past Medical History:  Diagnosis Date  . Arthritis    Knees, Shoulder Right  . Cancer ([place]) 2007   lung  . GERD (gastroesophageal reflux disease)   . Hypertension   . Poorly fitting dentures   . Uses hearing aid    Pt does not wear consistantly    Surgical History: Past Surgical History:  Procedure Laterality Date  . CATARACT EXTRACTION W/PHACO Left 09/21/2017   Procedure: CATARACT EXTRACTION PHACO AND INTRAOCULAR LENS PLACEMENT ([place])  LEFT;  Surgeon: Leandrew Koyanagi, MD;  Location: [facility];  Service: Ophthalmology;  Laterality: Left;  . COLONOSCOPY  2016  . COLONOSCOPY WITH PROPOFOL N/A 04/05/2018   Procedure: COLONOSCOPY WITH PROPOFOL;  Surgeon: Robert Bellow, MD;  Location: [facility];  Service: Endoscopy;  Laterality: N/A;  . EYE SURGERY    . LUNG SURGERY Right 10/2006   lung cancer surgery - [facility]  . RIB RESECTION Right 2007    Home Medications:  Allergies as of 07/31/2020      Reactions   Bee Venom Anaphylaxis      Medication List       Accurate as of July 31, 2020 11:59 PM. If you have any questions, ask your nurse or doctor.        STOP taking these medications   ibuprofen 600 MG tablet Commonly known as:  ADVIL Stopped by: Abbie Sons, MD   oxyCODONE-acetaminophen 5-325 MG tablet Commonly known as: Percocet Stopped by: Abbie Sons, MD     TAKE these medications   acetaminophen 500 MG tablet Commonly known as: TYLENOL Take 1,000 mg by mouth daily as needed for mild pain or moderate pain.   aspirin 81 MG tablet Take 81 mg by mouth daily.       Allergies:  Allergies  Allergen Reactions  . Bee Venom Anaphylaxis    Family History: Family History  Problem Relation Age of Onset  . Heart attack Father   . Heart disease Mother   . Cancer Sister        breast    Social History:  reports that he has been smoking. He has a 11.25 pack-year smoking history. He has never used smokeless tobacco. He reports that he does not drink alcohol and does not use drugs.   Physical Exam: BP (!) 202/91 (BP Location: Left Arm, Patient Position: Sitting, Cuff Size: Large)   Pulse 65   Ht 5' 2.9" (1.598 m)   Wt 201 lb 11.2 oz (91.5 kg)   BMI 35.84 kg/m   Constitutional:  Alert and oriented, No acute distress. HEENT: [place] AT, moist mucus membranes.  Trachea midline, no masses. Cardiovascular: No clubbing, cyanosis, or edema.  RRR Respiratory:  Normal respiratory effort, no increased work of breathing.  Clear GI: Abdomen is soft, nontender, nondistended, no abdominal masses GU: Phallus without lesions, testes descended bilaterally without masses or tenderness on the posterior right hemiscrotum near the perineal junction is a fluctuant mass measuring ~2 x 3 cm.  Minimal drainage noted, minimal tenderness.  Some induration of the perineum though no fluctuance Skin: No rashes, bruises or suspicious lesions. Neurologic: Grossly intact, no focal deficits, moving all 4 extremities. Psychiatric: Normal mood and affect.   Assessment & Plan:    1.  Right scrotal wall abscess  Findings were discussed in detail  Recommend I&D under anesthesia/sedation  The procedure was discussed in detail  clear potential risks of bleeding, infection  I recommended to schedule this tomorrow however he stated he would be unable to do until early next week.  It was recommended he proceed to the ED for development of fever or worsening pain.    Abbie Sons, [place] [address], [place], [place] [address] [phone]

## 2020-08-02 LAB — SARS CORONAVIRUS 2 (TAT 6-24 HRS): SARS Coronavirus 2: NEGATIVE

## 2020-08-04 ENCOUNTER — Encounter
Admission: RE | Admit: 2020-08-04 | Discharge: 2020-08-04 | Disposition: A | Discharge status: Home / Self Care | Payer: Medicare Other | Source: Ambulatory Visit | Attending: Urology | Admitting: Urology

## 2020-08-04 ENCOUNTER — Other Ambulatory Visit: Payer: Medicare Other

## 2020-08-04 ENCOUNTER — Other Ambulatory Visit: Payer: Self-pay

## 2020-08-04 NOTE — Patient Instructions (Signed)
Your procedure is scheduled on: Tuesday August 05, 2020. Report to Day Surgery inside [facility] 2nd floor. To find out your arrival time please call [phone] between 1PM - 3PM on Monday August 04, 2020.  Remember: Instructions that are not followed completely may result in serious medical risk,  up to and including death, or upon the discretion of your surgeon and anesthesiologist your  surgery may need to be rescheduled.     _X__ 1. Do not eat food after midnight the night before your procedure.                 No chewing gum or hard candies. You may drink clear liquids up to 2 hours                 before you are scheduled to arrive for your surgery- DO not drink clear                 liquids within 2 hours of the start of your surgery.                 Clear Liquids include:  water, apple juice without pulp, clear Gatorade, G2 or                  Gatorade Zero (avoid Red/Purple/Blue), Black Coffee or Tea (Do not add                 anything to coffee or tea).  __X__2.  On the morning of surgery brush your teeth with toothpaste and water, you                may rinse your mouth with mouthwash if you wish.  Do not swallow any toothpaste of mouthwash.     _X__ 3.  No Alcohol for 24 hours before or after surgery.   _X__ 4.  Do Not Smoke or use e-cigarettes For 24 Hours Prior to Your Surgery.                 Do not use any chewable tobacco products for at least 6 hours prior to                 Surgery.  _X__  5.  Do not use any recreational drugs (marijuana, cocaine, heroin, ecstasy, MDMA or other)                For at least one week prior to your surgery.  Combination of these drugs with anesthesia                May have life threatening results.  __x___6.  Notify your doctor if there is any change in your medical condition      (cold, fever, infections).     Do not wear jewelry, make-up, hairpins, clips or nail polish. Do not wear lotions,  powders, or perfumes. You may wear deodorant. Do not shave 48 hours prior to surgery. Men may shave face and neck. Do not bring valuables to the hospital.    [facility] is not responsible for any belongings or valuables.  Contacts, dentures or bridgework may not be worn into surgery. Leave your suitcase in the car. After surgery it may be brought to your room. For patients admitted to the hospital, discharge time is determined by your treatment team.   Patients discharged the day of surgery will not be allowed to drive home.   Make arrangements for someone to be with  you for the first 24 hours of your Same Day Discharge.   ____ Take these medicines the morning of surgery with A SIP OF WATER:    1. none .   __X__ Stop Anti-inflammatories such as Ibuprofen, Aleve, Advil, naproxe and or BC powders.    __X__ Stop supplements until after surgery.    __X__ Do not start any herbal supplements before your procedure.    If you have any questions regarding your pre-procedure instructions,  Please call Pre-admit Testing at [phone].

## 2020-08-05 ENCOUNTER — Encounter: Payer: Self-pay | Admitting: Urology

## 2020-08-05 ENCOUNTER — Ambulatory Visit: Payer: Medicare Other | Admitting: Anesthesiology

## 2020-08-05 ENCOUNTER — Telehealth: Payer: Self-pay | Admitting: Urology

## 2020-08-05 ENCOUNTER — Ambulatory Visit
Admission: RE | Admit: 2020-08-05 | Discharge: 2020-08-05 | Disposition: A | Discharge status: Home / Self Care | Payer: Medicare Other | Attending: Urology | Admitting: Urology

## 2020-08-05 ENCOUNTER — Encounter
Admission: RE | Disposition: A | Discharge status: Home / Self Care | Payer: Self-pay | Source: Home / Self Care | Attending: Urology

## 2020-08-05 DIAGNOSIS — E669 Obesity, unspecified: Secondary | ICD-10-CM | POA: Diagnosis not present

## 2020-08-05 DIAGNOSIS — I10 Essential (primary) hypertension: Secondary | ICD-10-CM | POA: Diagnosis not present

## 2020-08-05 DIAGNOSIS — M1990 Unspecified osteoarthritis, unspecified site: Secondary | ICD-10-CM | POA: Diagnosis not present

## 2020-08-05 DIAGNOSIS — N492 Inflammatory disorders of scrotum: Secondary | ICD-10-CM | POA: Insufficient documentation

## 2020-08-05 DIAGNOSIS — Z6835 Body mass index (BMI) 35.0-35.9, adult: Secondary | ICD-10-CM | POA: Insufficient documentation

## 2020-08-05 DIAGNOSIS — Z85118 Personal history of other malignant neoplasm of bronchus and lung: Secondary | ICD-10-CM | POA: Diagnosis not present

## 2020-08-05 DIAGNOSIS — Z803 Family history of malignant neoplasm of breast: Secondary | ICD-10-CM | POA: Diagnosis not present

## 2020-08-05 DIAGNOSIS — Z961 Presence of intraocular lens: Secondary | ICD-10-CM | POA: Insufficient documentation

## 2020-08-05 DIAGNOSIS — K219 Gastro-esophageal reflux disease without esophagitis: Secondary | ICD-10-CM | POA: Diagnosis not present

## 2020-08-05 DIAGNOSIS — Z8249 Family history of ischemic heart disease and other diseases of the circulatory system: Secondary | ICD-10-CM | POA: Diagnosis not present

## 2020-08-05 DIAGNOSIS — Z1629 Resistance to other single specified antibiotic: Secondary | ICD-10-CM | POA: Insufficient documentation

## 2020-08-05 DIAGNOSIS — Z91030 Bee allergy status: Secondary | ICD-10-CM | POA: Insufficient documentation

## 2020-08-05 DIAGNOSIS — B955 Unspecified streptococcus as the cause of diseases classified elsewhere: Secondary | ICD-10-CM | POA: Diagnosis not present

## 2020-08-05 DIAGNOSIS — Z9842 Cataract extraction status, left eye: Secondary | ICD-10-CM | POA: Diagnosis not present

## 2020-08-05 HISTORY — PX: INCISION AND DRAINAGE ABSCESS: SHX5864

## 2020-08-05 SURGERY — INCISION AND DRAINAGE, ABSCESS
Anesthesia: General | Site: Scrotum | Laterality: Right

## 2020-08-05 MED ORDER — PROMETHAZINE HCL 25 MG/ML IJ SOLN: 6.2500 mg | INTRAMUSCULAR | Status: DC | PRN

## 2020-08-05 MED ORDER — FAMOTIDINE 20 MG PO TABS
ORAL_TABLET | ORAL | Status: AC
  Administered 2020-08-05: 20 mg via ORAL
  Filled 2020-08-05: qty 1

## 2020-08-05 MED ORDER — CHLORHEXIDINE GLUCONATE 0.12 % MT SOLN: 15.0000 mL | Freq: Once | OROMUCOSAL | Status: AC

## 2020-08-05 MED ORDER — FENTANYL CITRATE (PF) 100 MCG/2ML IJ SOLN
INTRAMUSCULAR | Status: DC | PRN
Start: 2020-08-05 — End: 2020-08-05
  Administered 2020-08-05 (×4): 25 ug via INTRAVENOUS

## 2020-08-05 MED ORDER — OXYCODONE-ACETAMINOPHEN 5-325 MG PO TABS: 1.0000 | ORAL_TABLET | Freq: Four times a day (QID) | ORAL | 0 refills | Status: DC | PRN

## 2020-08-05 MED ORDER — SULFAMETHOXAZOLE-TRIMETHOPRIM 800-160 MG PO TABS: 1.0000 | ORAL_TABLET | Freq: Two times a day (BID) | ORAL | 0 refills | Status: AC

## 2020-08-05 MED ORDER — FENTANYL CITRATE (PF) 100 MCG/2ML IJ SOLN
25.0000 ug | INTRAMUSCULAR | Status: DC | PRN
  Administered 2020-08-05: 25 ug via INTRAVENOUS

## 2020-08-05 MED ORDER — PROPOFOL 10 MG/ML IV BOLUS
INTRAVENOUS | Status: DC | PRN
  Administered 2020-08-05: 180 mg via INTRAVENOUS
  Administered 2020-08-05: 20 mg via INTRAVENOUS

## 2020-08-05 MED ORDER — MIDAZOLAM HCL 2 MG/2ML IJ SOLN
INTRAMUSCULAR | Status: DC | PRN
  Administered 2020-08-05: 2 mg via INTRAVENOUS

## 2020-08-05 MED ORDER — LACTATED RINGERS IV SOLN: INTRAVENOUS | Status: DC

## 2020-08-05 MED ORDER — OXYCODONE HCL 5 MG PO TABS
5.0000 mg | ORAL_TABLET | Freq: Once | ORAL | Status: AC | PRN
  Administered 2020-08-05: 5 mg via ORAL

## 2020-08-05 MED ORDER — PROPOFOL 10 MG/ML IV BOLUS
INTRAVENOUS | Status: AC
  Filled 2020-08-05: qty 20

## 2020-08-05 MED ORDER — MIDAZOLAM HCL 2 MG/2ML IJ SOLN
INTRAMUSCULAR | Status: AC
  Filled 2020-08-05: qty 2

## 2020-08-05 MED ORDER — ORAL CARE MOUTH RINSE: 15.0000 mL | Freq: Once | OROMUCOSAL | Status: AC

## 2020-08-05 MED ORDER — BUPIVACAINE HCL (PF) 0.5 % IJ SOLN
INTRAMUSCULAR | Status: AC
  Filled 2020-08-05: qty 30

## 2020-08-05 MED ORDER — LIDOCAINE HCL (CARDIAC) PF 100 MG/5ML IV SOSY
PREFILLED_SYRINGE | INTRAVENOUS | Status: DC | PRN
  Administered 2020-08-05: 80 mg via INTRAVENOUS

## 2020-08-05 MED ORDER — FENTANYL CITRATE (PF) 100 MCG/2ML IJ SOLN
INTRAMUSCULAR | Status: AC
  Filled 2020-08-05: qty 2

## 2020-08-05 MED ORDER — LIDOCAINE HCL (PF) 2 % IJ SOLN
INTRAMUSCULAR | Status: AC
  Filled 2020-08-05: qty 5

## 2020-08-05 MED ORDER — CEFAZOLIN SODIUM-DEXTROSE 2-4 GM/100ML-% IV SOLN
INTRAVENOUS | Status: AC
  Filled 2020-08-05: qty 100

## 2020-08-05 MED ORDER — CHLORHEXIDINE GLUCONATE 0.12 % MT SOLN
OROMUCOSAL | Status: AC
  Administered 2020-08-05: 15 mL via OROMUCOSAL
  Filled 2020-08-05: qty 15

## 2020-08-05 MED ORDER — OXYCODONE HCL 5 MG PO TABS
ORAL_TABLET | ORAL | Status: DC
Start: 2020-08-05 — End: 2020-08-05
  Filled 2020-08-05: qty 1

## 2020-08-05 MED ORDER — FAMOTIDINE 20 MG PO TABS: 20.0000 mg | ORAL_TABLET | Freq: Once | ORAL | Status: AC

## 2020-08-05 MED ORDER — DEXMEDETOMIDINE (PRECEDEX) IN NS 20 MCG/5ML (4 MCG/ML) IV SYRINGE
PREFILLED_SYRINGE | INTRAVENOUS | Status: DC | PRN
  Administered 2020-08-05 (×3): 4 ug via INTRAVENOUS

## 2020-08-05 MED ORDER — FENTANYL CITRATE (PF) 100 MCG/2ML IJ SOLN
INTRAMUSCULAR | Status: AC
  Administered 2020-08-05: 25 ug via INTRAVENOUS
  Filled 2020-08-05: qty 2

## 2020-08-05 MED ORDER — CEFAZOLIN SODIUM-DEXTROSE 2-4 GM/100ML-% IV SOLN
2.0000 g | INTRAVENOUS | Status: AC
  Administered 2020-08-05: 2 g via INTRAVENOUS

## 2020-08-05 MED ORDER — OXYCODONE HCL 5 MG/5ML PO SOLN: 5.0000 mg | Freq: Once | ORAL | Status: AC | PRN

## 2020-08-05 MED ORDER — MEPERIDINE HCL 50 MG/ML IJ SOLN: 6.2500 mg | INTRAMUSCULAR | Status: DC | PRN

## 2020-08-05 MED ORDER — LIDOCAINE-EPINEPHRINE (PF) 1 %-1:200000 IJ SOLN
INTRAMUSCULAR | Status: AC
  Filled 2020-08-05: qty 30

## 2020-08-05 MED ORDER — ONDANSETRON HCL 4 MG/2ML IJ SOLN
INTRAMUSCULAR | Status: DC | PRN
  Administered 2020-08-05: 4 mg via INTRAVENOUS

## 2020-08-05 SURGICAL SUPPLY — 36 items
ADH SKN CLS APL DERMABOND .7 (GAUZE/BANDAGES/DRESSINGS)
APL PRP STRL LF DISP 70% ISPRP (MISCELLANEOUS)
BNDG CONFORM 2 STRL LF (GAUZE/BANDAGES/DRESSINGS) IMPLANT
CANISTER SUCT 1200ML W/VALVE (MISCELLANEOUS) ×3 IMPLANT
CHLORAPREP W/TINT 26 (MISCELLANEOUS) IMPLANT
COVER WAND RF STERILE (DRAPES) IMPLANT
DERMABOND ADVANCED (GAUZE/BANDAGES/DRESSINGS)
DERMABOND ADVANCED .7 DNX12 (GAUZE/BANDAGES/DRESSINGS) IMPLANT
DRAIN PENROSE 1/4X12 LTX STRL (WOUND CARE) IMPLANT
DRAPE LAPAROTOMY 77X122 PED (DRAPES) ×3 IMPLANT
DRSG GAUZE FLUFF 36X18 (GAUZE/BANDAGES/DRESSINGS) ×3 IMPLANT
ELECT REM PT RETURN 9FT ADLT (ELECTROSURGICAL) ×3
ELECTRODE REM PT RTRN 9FT ADLT (ELECTROSURGICAL) ×1 IMPLANT
GAUZE SPONGE 4X4 12PLY STRL (GAUZE/BANDAGES/DRESSINGS) ×3 IMPLANT
GLOVE BIOGEL PI IND STRL 7.5 (GLOVE) ×1 IMPLANT
GLOVE BIOGEL PI INDICATOR 7.5 (GLOVE) ×2
GOWN STRL REUS W/ TWL LRG LVL3 (GOWN DISPOSABLE) ×2 IMPLANT
GOWN STRL REUS W/ TWL XL LVL3 (GOWN DISPOSABLE) IMPLANT
GOWN STRL REUS W/TWL LRG LVL3 (GOWN DISPOSABLE) ×6
GOWN STRL REUS W/TWL XL LVL3 (GOWN DISPOSABLE)
KIT TURNOVER KIT A (KITS) ×3 IMPLANT
LABEL OR SOLS (LABEL) IMPLANT
NEEDLE HYPO 25X1 1.5 SAFETY (NEEDLE) ×3 IMPLANT
NS IRRIG 500ML POUR BTL (IV SOLUTION) ×3 IMPLANT
PACK BASIN MINOR (MISCELLANEOUS) ×3 IMPLANT
SOL PREP PVP 2OZ (MISCELLANEOUS) ×3
SOLUTION PREP PVP 2OZ (MISCELLANEOUS) ×1 IMPLANT
SUPPORETR ATHLETIC LG (MISCELLANEOUS) ×1 IMPLANT
SUPPORTER ATHLETIC LG (MISCELLANEOUS) ×3
SUT ETHILON 3-0 FS-10 30 BLK (SUTURE)
SUT VIC AB 3-0 SH 27 (SUTURE) ×3
SUT VIC AB 3-0 SH 27X BRD (SUTURE) ×1 IMPLANT
SUT VIC AB 4-0 SH 27 (SUTURE)
SUT VIC AB 4-0 SH 27XANBCTRL (SUTURE) IMPLANT
SUTURE EHLN 3-0 FS-10 30 BLK (SUTURE) IMPLANT
SYR 10ML LL (SYRINGE) ×3 IMPLANT

## 2020-08-05 NOTE — Anesthesia Postprocedure Evaluation (Signed)
Anesthesia Post Note  Patient: Larry Luna  Procedure(s) Performed: INCISION AND DRAINAGE Scrotal ABSCESS (Right Scrotum)  Patient location during evaluation: PACU Anesthesia Type: General Level of consciousness: awake and alert and oriented Pain management: pain level controlled Vital Signs Assessment: post-procedure vital signs reviewed and stable Respiratory status: spontaneous breathing, nonlabored ventilation and respiratory function stable Cardiovascular status: blood pressure returned to baseline and stable Postop Assessment: no signs of nausea or vomiting Anesthetic complications: no   No complications documented.   Last Vitals:  Vitals:   08/05/20 1216 08/05/20 1230  BP:  (!) 142/90  Pulse:  (!) 59  Resp:  18  Temp: (!) 36.1 C (!) 36 C  SpO2:  95%    Last Pain:  Vitals:   08/05/20 1230  TempSrc: Temporal  PainSc: 0-No pain                 Mindy Behnken

## 2020-08-05 NOTE — Anesthesia Procedure Notes (Addendum)
Procedure Name: LMA Insertion Date/Time: 08/05/2020 10:50 AM Performed by: Allean Found, CRNA Pre-anesthesia Checklist: Patient identified, Patient being monitored, Timeout performed, Emergency Drugs available and Suction available Patient Re-evaluated:Patient Re-evaluated prior to induction Oxygen Delivery Method: Circle system utilized Preoxygenation: Pre-oxygenation with 100% oxygen Induction Type: IV induction Ventilation: Mask ventilation without difficulty LMA: LMA inserted LMA Size: 5.0 and 4.0 Grade View: Grade I Tube type: Oral Tube size: 7.0 mm Number of attempts: 1 Airway Equipment and Method: Stylet Placement Confirmation: ETT inserted through vocal cords under direct vision,  positive ETCO2 and breath sounds checked- equal and bilateral Tube secured with: Tape Dental Injury: Teeth and Oropharynx as per pre-operative assessment

## 2020-08-05 NOTE — Discharge Instructions (Signed)
AMBULATORY SURGERY  DISCHARGE INSTRUCTIONS   1) The drugs that you were given will stay in your system until tomorrow so for the next 24 hours you should not:  A) Drive an automobile B) Make any legal decisions C) Drink any alcoholic beverage   2) You may resume regular meals tomorrow.  Today it is better to start with liquids and gradually work up to solid foods.  You may eat anything you prefer, but it is better to start with liquids, then soup and crackers, and gradually work up to solid foods.   3) Please notify your doctor immediately if you have any unusual bleeding, trouble breathing, redness and pain at the surgery site, drainage, fever, or pain not relieved by medication. 4)   5) Your post-operative visit with Dr.                                     is: Date:                        Time:    Please call to schedule your post-operative visit.  6) Additional Instructions:   A prescription for pain medication and antibiotic was sent to your pharmacy  Keep site dry until your dressing change on 9/16  Call office for fever greater than 101 degrees or any increased redness, drainage [phone]

## 2020-08-05 NOTE — Telephone Encounter (Signed)
-----   Message from Abbie Sons, MD sent at 08/05/2020 11:40 AM EDT ----- Regarding: Follow-up Please schedule follow-up appointment with Larry Luna or Larry Luna Thursday 9/16 for scrotal dressing change.

## 2020-08-05 NOTE — Anesthesia Procedure Notes (Signed)
Performed by: Jaquita Bessire, CRNA       

## 2020-08-05 NOTE — Anesthesia Preprocedure Evaluation (Signed)
Anesthesia Evaluation  Patient identified by MRN, date of birth, ID band Patient awake    Reviewed: Allergy & Precautions, NPO status , Patient's Chart, lab work & pertinent test results  History of Anesthesia Complications Negative for: history of anesthetic complications  Airway Mallampati: II  TM Distance: >3 FB Neck ROM: Full    Dental  (+) Poor Dentition, Missing   Pulmonary neg sleep apnea, neg COPD, Current SmokerPatient did not abstain from smoking.,    breath sounds clear to auscultation- rhonchi (-) wheezing      Cardiovascular hypertension, Pt. on medications (-) CAD, (-) Past MI, (-) Cardiac Stents and (-) CABG  Rhythm:Regular Rate:Normal - Systolic murmurs and - Diastolic murmurs    Neuro/Psych neg Seizures negative neurological ROS  negative psych ROS   GI/Hepatic Neg liver ROS, GERD  ,  Endo/Other  negative endocrine ROSneg diabetes  Renal/GU negative Renal ROS     Musculoskeletal  (+) Arthritis ,   Abdominal (+) + obese,   Peds  Hematology negative hematology ROS (+)   Anesthesia Other Findings Past Medical History: No date: Arthritis     Comment:  Knees, Shoulder Right 2007: Cancer ([place])     Comment:  lung No date: GERD (gastroesophageal reflux disease) No date: Hypertension No date: Poorly fitting dentures No date: Uses hearing aid     Comment:  Pt does not wear consistantly   Reproductive/Obstetrics                             Anesthesia Physical Anesthesia Plan  ASA: II  Anesthesia Plan: General   Post-op Pain Management:    Induction: Intravenous  PONV Risk Score and Plan: 0 and Ondansetron  Airway Management Planned: LMA  Additional Equipment:   Intra-op Plan:   Post-operative Plan:   Informed Consent: I have reviewed the patients History and Physical, chart, labs and discussed the procedure including the risks, benefits and alternatives for the  proposed anesthesia with the patient or authorized representative who has indicated his/her understanding and acceptance.     Dental advisory given  Plan Discussed with: CRNA and Anesthesiologist  Anesthesia Plan Comments:         Anesthesia Quick Evaluation

## 2020-08-05 NOTE — Transfer of Care (Signed)
Immediate Anesthesia Transfer of Care Note  Patient: Larry Luna  Procedure(s) Performed: INCISION AND DRAINAGE Scrotal ABSCESS (Right Scrotum)  Patient Location: PACU  Anesthesia Type:General  Level of Consciousness: drowsy  Airway & Oxygen Therapy: Patient Spontanous Breathing and Patient connected to face mask oxygen  Post-op Assessment: Report given to RN and Post -op Vital signs reviewed and stable  Post vital signs: Reviewed and stable  Last Vitals:  Vitals Value Taken Time  BP 94/61 08/05/20 1130  Temp    Pulse 70 08/05/20 1132  Resp 18 08/05/20 1132  SpO2 99 % 08/05/20 1132  Vitals shown include unvalidated device data.  Last Pain:  Vitals:   08/05/20 0918  TempSrc: Oral  PainSc: 5          Complications: No complications documented.

## 2020-08-05 NOTE — Interval H&P Note (Signed)
History and Physical Interval Note:  08/05/2020 10:09 AM  Larry Luna  has presented today for surgery, with the diagnosis of right hemiscrotal abcess.  The various methods of treatment have been discussed with the patient and family. After consideration of risks, benefits and other options for treatment, the patient has consented to  Procedure(s): INCISION AND DRAINAGE Scrotal ABSCESS (Right) as a surgical intervention.  The patient's history has been reviewed, patient examined, no change in status, stable for surgery.  I have reviewed the patient's chart and labs.  Questions were answered to the patient's satisfaction.     [place]

## 2020-08-05 NOTE — Op Note (Signed)
Preoperative diagnosis:  1. Right hemiscrotal abscess  Postoperative diagnosis:  1. Same  Procedure: 1. Incision and drainage superficial right hemiscrotal abscess  Surgeon: Abbie Sons, MD  Anesthesia: General  Complications: None  Intraoperative findings:  Since seen in the office last week there had been significant drainage of the abscess.  Several sinus tracts were noted in this area consistent with hidradenitis suppurativa  EBL: Minimal  Specimens: Culture sent  Indication: Larry Luna is a 64 y.o. with a history of recurrent scrotal wall abscess.  After reviewing the management options for treatment, he elected to proceed with the above surgical procedure(s). We have discussed the potential benefits and risks of the procedure, side effects of the proposed treatment, the likelihood of the patient achieving the goals of the procedure, and any potential problems that might occur during the procedure or recuperation. Informed consent has been obtained.  Description of procedure:  The patient was taken to the operating room and general anesthesia was induced.  The patient was placed in the dorsal lithotomy position, prepped and draped in the usual sterile fashion, and preoperative antibiotics were administered. A preoperative time-out was performed.   On the postero-lateral aspect of the right hemiscrotum a slightly fluctuant area was noted that had drained significantly from his prior office visit last week.  A longitudinal incision was made encompassing this area and a small amount of pus was drained there was a sinus tract laterally to this area that was included in the incision.  There was also a tract extending inferiorly and the incision was further extended.  There was no extension into the perineum.  Hemostasis was obtained with cautery.  There was not a significant abscess cavity and the edges were loosely approximated with two 3-0 Vicryl sutures over a 1/4 inch  iodoform gauze.  A dressing of 4 x 4's, fluffs and mesh underwear was applied.  Plan: Office follow-up 9/16 for dressing change.   Abbie Sons, M.D.

## 2020-08-05 NOTE — Telephone Encounter (Signed)
App made pt notified

## 2020-08-07 ENCOUNTER — Ambulatory Visit
Admission: RE | Admit: 2020-08-07 | Discharge: 2020-08-07 | Disposition: A | Discharge status: Home / Self Care | Payer: Medicare Other | Source: Ambulatory Visit | Attending: Physician Assistant | Admitting: Physician Assistant

## 2020-08-07 ENCOUNTER — Other Ambulatory Visit: Payer: Self-pay

## 2020-08-07 ENCOUNTER — Ambulatory Visit: Payer: Medicare Other | Admitting: Physician Assistant

## 2020-08-07 ENCOUNTER — Encounter: Payer: Self-pay | Admitting: Physician Assistant

## 2020-08-07 VITALS — BP 162/84 | HR 76 | Ht 63.0 in | Wt 202.0 lb

## 2020-08-07 DIAGNOSIS — N492 Inflammatory disorders of scrotum: Secondary | ICD-10-CM

## 2020-08-07 DIAGNOSIS — M7989 Other specified soft tissue disorders: Secondary | ICD-10-CM

## 2020-08-07 NOTE — Progress Notes (Signed)
08/07/2020 10:20 AM   Larry Luna 03-05-1956 [id number]  CC: Chief Complaint  Patient presents with  . Follow-up   HPI: Larry Luna is a 64 y.o. male who is POD 2 from I&D of a superficial right hemiscrotal abscess with Dr. Bernardo Heater who presents today for dressing change.  He is accompanied by his wife.  Today, patient reports feeling well since surgery.  He reports some right lower extremity edema and pain with sudden onset yesterday.  He has had right lower extremity edema in the past, however he states his current pain and edema are atypical for him.    Patient reports a history of recurrent scrotal abscesses over the past year.  He denies a history of axillary abscesses and denies any superficial scarring associated with these.  He does smoke.  He has never seen a dermatologist for management of these.  PMH: Past Medical History:  Diagnosis Date  . Arthritis    Knees, Shoulder Right  . Cancer ([place]) 2007   lung  . GERD (gastroesophageal reflux disease)   . Hypertension   . Poorly fitting dentures   . Uses hearing aid    Pt does not wear consistantly    Surgical History: Past Surgical History:  Procedure Laterality Date  . CATARACT EXTRACTION W/PHACO Left 09/21/2017   Procedure: CATARACT EXTRACTION PHACO AND INTRAOCULAR LENS PLACEMENT ([place])  LEFT;  Surgeon: Leandrew Koyanagi, MD;  Location: [facility];  Service: Ophthalmology;  Laterality: Left;  . COLONOSCOPY  2016  . COLONOSCOPY WITH PROPOFOL N/A 04/05/2018   Procedure: COLONOSCOPY WITH PROPOFOL;  Surgeon: Robert Bellow, MD;  Location: [facility];  Service: Endoscopy;  Laterality: N/A;  . EYE SURGERY    . INCISION AND DRAINAGE ABSCESS Right 08/05/2020   Procedure: INCISION AND DRAINAGE Scrotal ABSCESS;  Surgeon: Abbie Sons, MD;  Location: [facility] ORS;  Service: Urology;  Laterality: Right;  . LUNG SURGERY Right 10/2006   lung cancer surgery - [facility]  . RIB RESECTION Right 2007    Home  Medications:  Allergies as of 08/07/2020      Reactions   Bee Venom Anaphylaxis      Medication List       Accurate as of August 07, 2020 10:20 AM. If you have any questions, ask your nurse or doctor.        aspirin 81 MG tablet Take 81 mg by mouth daily.   oxyCODONE-acetaminophen 5-325 MG tablet Commonly known as: PERCOCET/ROXICET Take 1 tablet by mouth every 6 (six) hours as needed for severe pain.   sulfamethoxazole-trimethoprim 800-160 MG tablet Commonly known as: BACTRIM DS Take 1 tablet by mouth 2 (two) times daily for 5 days.       Allergies:  Allergies  Allergen Reactions  . Bee Venom Anaphylaxis    Family History: Family History  Problem Relation Age of Onset  . Heart attack Father   . Heart disease Mother   . Cancer Sister        breast    Social History:   reports that he has been smoking. He has a 11.25 pack-year smoking history. He has never used smokeless tobacco. He reports that he does not drink alcohol and does not use drugs.  Physical Exam: BP (!) 162/84 (BP Location: Left Arm, Patient Position: Sitting, Cuff Size: Normal)   Pulse 76   Ht 5\' 3"  (1.6 m)   Wt 202 lb (91.6 kg)   BMI 35.78 kg/m   Constitutional:  Alert  and oriented, no acute distress, nontoxic appearing HEENT: [place], AT Cardiovascular: No clubbing, cyanosis, or edema Respiratory: Normal respiratory effort, no increased work of breathing GU: No inguinal scarring consistent with chronic hidradenitis.  Iodoform gauze packing visualized protruding from the surgical incision with 2 Vicryl sutures noted to be intact. Skin: No rashes, bruises or suspicious lesions MSK: Edematous right thigh and lateral right knee without warmth or erythema. Neurologic: Grossly intact, no focal deficits, moving all 4 extremities Psychiatric: Normal mood and affect  Pertinent Imaging: RLE venous Doppler, 08/07/2020: CLINICAL DATA:  65 year old male with a history of right  leg swelling  EXAM: RIGHT LOWER EXTREMITY VENOUS DOPPLER ULTRASOUND  TECHNIQUE: Gray-scale sonography with graded compression, as well as color Doppler and duplex ultrasound were performed to evaluate the lower extremity deep venous systems from the level of the common femoral vein and including the common femoral, femoral, profunda femoral, popliteal and calf veins including the posterior tibial, peroneal and gastrocnemius veins when visible. The superficial great saphenous vein was also interrogated. Spectral Doppler was utilized to evaluate flow at rest and with distal augmentation maneuvers in the common femoral, femoral and popliteal veins.  COMPARISON:  None.  FINDINGS: Contralateral Common Femoral Vein: Respiratory phasicity is normal and symmetric with the symptomatic side. No evidence of thrombus. Normal compressibility.  Common Femoral Vein: No evidence of thrombus. Normal compressibility, respiratory phasicity and response to augmentation.  Saphenofemoral Junction: No evidence of thrombus. Normal compressibility and flow on color Doppler imaging.  Profunda Femoral Vein: No evidence of thrombus. Normal compressibility and flow on color Doppler imaging.  Femoral Vein: No evidence of thrombus. Normal compressibility, respiratory phasicity and response to augmentation.  Popliteal Vein: No evidence of thrombus. Normal compressibility, respiratory phasicity and response to augmentation.  Calf Veins: No evidence of thrombus. Normal compressibility and flow on color Doppler imaging.  Superficial Great Saphenous Vein: No evidence of thrombus. Normal compressibility and flow on color Doppler imaging.  Other Findings:  None.  IMPRESSION: Sonographic survey of the right lower extremity negative for DVT   Electronically Signed   By: Corrie Mckusick D.O.   On: 08/07/2020 13:31  I personally reviewed the images referenced above and note no  DVT.  Assessment & Plan:   1. Scrotal abscess Patient recovering well following I&D.  Using clean technique, I removed the patient's iodoform packing in place and replaced it with a new length of iodoform packing.  I counseled the patient and his wife to remove a length of the packing of approximately 1 inch daily and trim the exposed packing until it is removed in its entirety.  Afterward, I counseled his wife to replace the iodoform packing as demonstrated in clinic today to encourage healing through secondary intent.  I explained that the Vicryl sutures would dissolve on their own.  Patient and wife expressed understanding.  2. Right leg swelling Venous Doppler ultrasound obtained today to evaluate him for DVT in the setting of recent surgery.  Ultrasound negative.  Patient informed. - US Venous Img Lower Unilateral Right (DVT)   Return if symptoms worsen or fail to improve.  Debroah Loop, PA-C  [facility] [address] [place], [place] [address] [phone]

## 2020-08-07 NOTE — Patient Instructions (Addendum)
Every day, pull about a 1-inch length of the packing from the wound and cut off the extra to leave a small tail of material sticking out of the wound. When you have removed all the packing, replace it using the gauze strip in the brown bottle guided by a sterile q-tip and repeat the removal process so that the wound heals from the bottom up to prevent a new abscess in that spot.  The two sutures that he has in place will dissolve over time--you may see pieces of them fall off with this. That is okay.

## 2020-08-08 ENCOUNTER — Telehealth: Payer: Self-pay

## 2020-08-08 NOTE — Telephone Encounter (Signed)
Notified patient of negative ultrasound results per Sam. Patient verbalized understanding.

## 2020-08-10 LAB — AEROBIC/ANAEROBIC CULTURE W GRAM STAIN (SURGICAL/DEEP WOUND)

## 2020-09-05 ENCOUNTER — Other Ambulatory Visit: Payer: Self-pay | Admitting: Surgery

## 2020-09-10 ENCOUNTER — Other Ambulatory Visit: Payer: Self-pay

## 2020-09-10 ENCOUNTER — Encounter
Admission: RE | Admit: 2020-09-10 | Discharge: 2020-09-10 | Disposition: A | Discharge status: Home / Self Care | Payer: Medicare Other | Source: Ambulatory Visit | Attending: Surgery | Admitting: Surgery

## 2020-09-10 DIAGNOSIS — Z01818 Encounter for other preprocedural examination: Secondary | ICD-10-CM | POA: Insufficient documentation

## 2020-09-10 DIAGNOSIS — I440 Atrioventricular block, first degree: Secondary | ICD-10-CM | POA: Diagnosis not present

## 2020-09-10 LAB — COMPREHENSIVE METABOLIC PANEL
ALT: 23 U/L (ref 0–44)
AST: 17 U/L (ref 15–41)
Albumin: 4 g/dL (ref 3.5–5.0)
Alkaline Phosphatase: 65 U/L (ref 38–126)
Anion gap: 8 (ref 5–15)
BUN: 13 mg/dL (ref 8–23)
CO2: 27 mmol/L (ref 22–32)
Calcium: 9 mg/dL (ref 8.9–10.3)
Chloride: 103 mmol/L (ref 98–111)
Creatinine, Ser: 1.05 mg/dL (ref 0.61–1.24)
GFR, Estimated: >60 mL/min (ref >60)
Glucose, Bld: 88 mg/dL (ref 70–99)
Potassium: 4.4 mmol/L (ref 3.5–5.1)
Sodium: 138 mmol/L (ref 135–145)
Total Bilirubin: 0.7 mg/dL (ref 0.3–1.2)
Total Protein: 7.2 g/dL (ref 6.5–8.1)

## 2020-09-10 LAB — TYPE AND SCREEN
ABO/RH(D): B POS
Antibody Screen: NEGATIVE

## 2020-09-10 LAB — CBC WITH DIFFERENTIAL/PLATELET
Abs Immature Granulocytes: 0.06 10*3/uL (ref 0.00–0.07)
Basophils Absolute: 0.1 10*3/uL (ref 0.0–0.1)
Basophils Relative: 1 %
Eosinophils Absolute: 0.1 10*3/uL (ref 0.0–0.5)
Eosinophils Relative: 2 %
HCT: 43.1 % (ref 39.0–52.0)
Hemoglobin: 13.8 g/dL (ref 13.0–17.0)
Immature Granulocytes: 1 %
Lymphocytes Relative: 41 %
Lymphs Abs: 3 10*3/uL (ref 0.7–4.0)
MCH: 25.6 pg — ABNORMAL LOW (ref 26.0–34.0)
MCHC: 32 g/dL (ref 30.0–36.0)
MCV: 79.8 fL — ABNORMAL LOW (ref 80.0–100.0)
Monocytes Absolute: 0.6 10*3/uL (ref 0.1–1.0)
Monocytes Relative: 8 %
Neutro Abs: 3.6 10*3/uL (ref 1.7–7.7)
Neutrophils Relative %: 47 %
Platelets: 208 10*3/uL (ref 150–400)
RBC: 5.4 MIL/uL (ref 4.22–5.81)
RDW: 16.1 % — ABNORMAL HIGH (ref 11.5–15.5)
WBC: 7.4 10*3/uL (ref 4.0–10.5)
nRBC: 0 % (ref 0.0–0.2)

## 2020-09-10 LAB — URINALYSIS, ROUTINE W REFLEX MICROSCOPIC
Bilirubin Urine: NEGATIVE
Glucose, UA: NEGATIVE mg/dL
Hgb urine dipstick: NEGATIVE
Ketones, ur: NEGATIVE mg/dL
Leukocytes,Ua: NEGATIVE
Nitrite: NEGATIVE
Protein, ur: NEGATIVE mg/dL
Specific Gravity, Urine: 1.01 (ref 1.005–1.030)
pH: 5 (ref 5.0–8.0)

## 2020-09-10 NOTE — Patient Instructions (Addendum)
Your procedure is scheduled on:09/16/20- Tuesday Report to Day Surgery on the 2nd floor of the [facility]. To find out your arrival time, please call [phone] between 1PM - 3PM on: 09/15/20- monday  REMEMBER: Instructions that are not followed completely may result in serious medical risk, up to and including death; or upon the discretion of your surgeon and anesthesiologist your surgery may need to be rescheduled.  Do not eat food after midnight the night before surgery.  No gum chewing, lozengers or hard candies.  You may however, drink CLEAR liquids up to 2 hours before you are scheduled to arrive for your surgery. Do not drink anything within 2 hours of your scheduled arrival time.  Clear liquids include: - water  - apple juice without pulp - gatorade (not RED, PURPLE, OR BLUE) - black coffee or tea (Do NOT add milk or creamers to the coffee or tea) Do NOT drink anything that is not on this list.  Type 1 and Type 2 diabetics should only drink water.  In addition, your doctor has ordered for you to drink the provided  Ensure Pre-Surgery Clear Carbohydrate Drink  Drinking this carbohydrate drink up to two hours before surgery helps to reduce insulin resistance and improve patient outcomes. Please complete drinking 2 hours prior to scheduled arrival time.  TAKE THESE MEDICATIONS THE MORNING OF SURGERY WITH A SIP OF WATER: - Tylenol if needed - Pepcid 20 mg night before surgery and the morning of surgery.  One week prior to surgery: Stop Anti-inflammatories (NSAIDS) such as Advil, Aleve, Ibuprofen, Motrin, Naproxen, Naprosyn and Aspirin based products such as Excedrin, Goodys Powder, BC Powder. Stop ANY OVER THE COUNTER supplements until after surgery.  (You may continue taking Tylenol, Vitamin D, Vitamin B, and multivitamin.)  No Alcohol for 24 hours before or after surgery.  No Smoking including e-cigarettes for 24 hours prior to surgery.  No chewable tobacco products  for at least 6 hours prior to surgery.  No nicotine patches on the day of surgery.  Do not use any "recreational" drugs for at least a week prior to your surgery.  Please be advised that the combination of cocaine and anesthesia may have negative outcomes, up to and including death. If you test positive for cocaine, your surgery will be cancelled.  On the morning of surgery brush your teeth with toothpaste and water, you may rinse your mouth with mouthwash if you wish. Do not swallow any toothpaste or mouthwash.  Do not wear jewelry, make-up, hairpins, clips or nail polish.  Do not wear lotions, powders, or perfumes.   Do not shave 48 hours prior to surgery.   Contact lenses, hearing aids and dentures may not be worn into surgery.  Do not bring valuables to the hospital. [facility] is not responsible for any missing/lost belongings or valuables.   Use CHG Soap or wipes as directed on instruction sheet.  Notify your doctor if there is any change in your medical condition (cold, fever, infection).  Wear comfortable clothing (specific to your surgery type) to the hospital.  Plan for stool softeners for home use; pain medications have a tendency to cause constipation. You can also help prevent constipation by eating foods high in fiber such as fruits and vegetables and drinking plenty of fluids as your diet allows.  After surgery, you can help prevent lung complications by doing breathing exercises.  Take deep breaths and cough every 1-2 hours. Your doctor may order a device called an Incentive  Spirometer to help you take deep breaths. When coughing or sneezing, hold a pillow firmly against your incision with both hands. This is called splinting. Doing this helps protect your incision. It also decreases belly discomfort.  If you are being admitted to the hospital overnight, leave your suitcase in the car. After surgery it may be brought to your room.  If you are being discharged  the day of surgery, you will not be allowed to drive home. You will need a responsible adult (18 years or older) to drive you home and stay with you that night.   If you are taking public transportation, you will need to have a responsible adult (18 years or older) with you. Please confirm with your physician that it is acceptable to use public transportation.   Please call the [facility] Dept. at [phone] if you have any questions about these instructions.  Visitation Policy:  Patients undergoing a surgery or procedure may have one family member or support person with them as long as that person is not COVID-19 positive or experiencing its symptoms.  That person may remain in the waiting area during the procedure.  Inpatient Visitation Update:   In an effort to ensure the safety of our team members and our patients, we are implementing a change to our visitation policy:  Effective Monday, Aug. 9, at 7 a.m., inpatients will be allowed one support person.  o The support person may change daily.  o The support person must pass our screening, gel in and out, and wear a mask at all times, including in the patients room.  o Patients must also wear a mask when staff or their support person are in the room.  o Masking is required regardless of vaccination status.  Systemwide, no visitors 17 or younger.

## 2020-09-11 DIAGNOSIS — I10 Essential (primary) hypertension: Secondary | ICD-10-CM | POA: Insufficient documentation

## 2020-09-11 NOTE — Progress Notes (Signed)
  [facility] Perioperative Services: Pre-Admission/Anesthesia Testing     Date: 09/11/20  Name: Larry Luna MRN:   [id number]  Re: Cardiac clearance prior to elective orthopedic surgery   Case: [id number] Date/Time: 09/16/20 1241   Procedure: TOTAL KNEE ARTHROPLASTY (Right Knee)   Anesthesia type: Choice   Pre-op diagnosis: PRIMARY OSTEOARTHRITIS OF RIGHT KNEE.   Location: [place] OR ROOM 03 / [place] ORS FOR ANESTHESIA GROUP   Surgeons: Corky Mull, MD     Patient scheduled to undergo the above procedure on 09/16/2020 with Dr. Jenny Reichmann pathology.  Patient presented to PAT clinic on 09/10/2020 for preoperative testing.  As part of his preoperative testing, patient had a twelve-lead ECG which showed presumably new lateral T wave inversions; leads I and V4-V6.  Patient denies any angina or anginal equivalent symptoms.  He is active at home with a functional capacity felt to be >4 METS.  Patient denies any significant cardiac history.  He reports that he has never seen cardiology in the past.  Given patient's acute ECG findings, he was referred to cardiology for further evaluation and presurgical cardiac clearance.  Patient was seen by cardiology Saralyn Pilar, MD) on 09/11/2020; notes reviewed.  Blood pressure was elevated in the office at 158/90; patient does not take any oral antihypertensives.  He is on daily ASA therapy.  Beta-blocker therapy was initiated using metoprolol 50 mg daily.  DASH diet and lifestyle modification recommended for patient's hypertension.  In the absence of symptoms, the decision was made to defer cardiac diagnostics at this time.  Per cardiology, patient may proceed with right total knee replacement surgery as scheduled.  He is felt to be at an acceptable cardiovascular risk.  Larry Loh, MSN, APRN, FNP-C, CEN [facility]  Peri-operative Services Nurse Practitioner Phone: [phone] 09/11/20 12:54 PM

## 2020-09-12 ENCOUNTER — Other Ambulatory Visit: Admission: RE | Admit: 2020-09-12 | Payer: Medicare Other | Source: Ambulatory Visit

## 2020-10-17 ENCOUNTER — Other Ambulatory Visit
Admission: RE | Admit: 2020-10-17 | Discharge: 2020-10-17 | Disposition: A | Discharge status: Home / Self Care | Payer: Medicare Other | Source: Ambulatory Visit | Attending: Surgery | Admitting: Surgery

## 2020-10-17 ENCOUNTER — Other Ambulatory Visit: Payer: Self-pay

## 2020-10-17 ENCOUNTER — Other Ambulatory Visit: Payer: Medicare Other

## 2020-10-17 DIAGNOSIS — Z20822 Contact with and (suspected) exposure to covid-19: Secondary | ICD-10-CM | POA: Insufficient documentation

## 2020-10-17 DIAGNOSIS — Z01818 Encounter for other preprocedural examination: Secondary | ICD-10-CM | POA: Diagnosis present

## 2020-10-17 LAB — SARS CORONAVIRUS 2 (TAT 6-24 HRS): SARS Coronavirus 2: NEGATIVE

## 2020-10-20 MED ORDER — LACTATED RINGERS IV SOLN: INTRAVENOUS | Status: DC

## 2020-10-20 MED ORDER — CEFAZOLIN SODIUM-DEXTROSE 2-4 GM/100ML-% IV SOLN
2.0000 g | INTRAVENOUS | Status: AC
  Administered 2020-10-21: 2 g via INTRAVENOUS

## 2020-10-20 MED ORDER — CHLORHEXIDINE GLUCONATE 0.12 % MT SOLN: 15.0000 mL | Freq: Once | OROMUCOSAL | Status: AC

## 2020-10-20 MED ORDER — ORAL CARE MOUTH RINSE: 15.0000 mL | Freq: Once | OROMUCOSAL | Status: AC

## 2020-10-21 ENCOUNTER — Inpatient Hospital Stay: Payer: Medicare Other | Admitting: Urgent Care

## 2020-10-21 ENCOUNTER — Encounter
Admission: RE | Disposition: A | Discharge status: Home / Self Care | Payer: Self-pay | Source: Home / Self Care | Attending: Surgery

## 2020-10-21 ENCOUNTER — Other Ambulatory Visit: Payer: Self-pay

## 2020-10-21 ENCOUNTER — Encounter: Payer: Self-pay | Admitting: Surgery

## 2020-10-21 ENCOUNTER — Ambulatory Visit
Admission: RE | Admit: 2020-10-21 | Discharge: 2020-10-21 | Disposition: A | Discharge status: Home / Self Care | Payer: Medicare Other | Attending: Surgery | Admitting: Surgery

## 2020-10-21 ENCOUNTER — Inpatient Hospital Stay: Payer: Medicare Other

## 2020-10-21 DIAGNOSIS — F17210 Nicotine dependence, cigarettes, uncomplicated: Secondary | ICD-10-CM | POA: Insufficient documentation

## 2020-10-21 DIAGNOSIS — Z79899 Other long term (current) drug therapy: Secondary | ICD-10-CM | POA: Diagnosis not present

## 2020-10-21 DIAGNOSIS — Z96651 Presence of right artificial knee joint: Secondary | ICD-10-CM | POA: Insufficient documentation

## 2020-10-21 DIAGNOSIS — Z7982 Long term (current) use of aspirin: Secondary | ICD-10-CM | POA: Diagnosis not present

## 2020-10-21 DIAGNOSIS — M1711 Unilateral primary osteoarthritis, right knee: Secondary | ICD-10-CM | POA: Diagnosis present

## 2020-10-21 DIAGNOSIS — Z87892 Personal history of anaphylaxis: Secondary | ICD-10-CM | POA: Insufficient documentation

## 2020-10-21 DIAGNOSIS — Z91030 Bee allergy status: Secondary | ICD-10-CM | POA: Insufficient documentation

## 2020-10-21 HISTORY — PX: TOTAL KNEE ARTHROPLASTY: SHX125

## 2020-10-21 LAB — TYPE AND SCREEN
ABO/RH(D): B POS
Antibody Screen: NEGATIVE

## 2020-10-21 SURGERY — ARTHROPLASTY, KNEE, TOTAL
Anesthesia: General | Site: Knee | Laterality: Right

## 2020-10-21 MED ORDER — TRANEXAMIC ACID 1000 MG/10ML IV SOLN
INTRAVENOUS | Status: DC | PRN
  Administered 2020-10-21: 1000 mg via TOPICAL

## 2020-10-21 MED ORDER — SODIUM CHLORIDE 0.9 % BOLUS PEDS: 250.0000 mL | Freq: Once | INTRAVENOUS | Status: DC

## 2020-10-21 MED ORDER — OXYCODONE HCL 5 MG PO TABS
5.0000 mg | ORAL_TABLET | ORAL | 0 refills | Status: DC | PRN
Start: 2020-10-21 — End: 2022-08-17

## 2020-10-21 MED ORDER — TRANEXAMIC ACID 1000 MG/10ML IV SOLN
INTRAVENOUS | Status: AC
  Filled 2020-10-21: qty 10

## 2020-10-21 MED ORDER — CEFAZOLIN SODIUM-DEXTROSE 2-4 GM/100ML-% IV SOLN
INTRAVENOUS | Status: AC
  Filled 2020-10-21: qty 100

## 2020-10-21 MED ORDER — PHENYLEPHRINE HCL (PRESSORS) 10 MG/ML IV SOLN
INTRAVENOUS | Status: DC | PRN
  Administered 2020-10-21: 50 ug via INTRAVENOUS
  Administered 2020-10-21: 100 ug via INTRAVENOUS
  Administered 2020-10-21: 50 ug via INTRAVENOUS
  Administered 2020-10-21: 100 ug via INTRAVENOUS

## 2020-10-21 MED ORDER — SODIUM CHLORIDE FLUSH 0.9 % IV SOLN
INTRAVENOUS | Status: AC
  Filled 2020-10-21: qty 40

## 2020-10-21 MED ORDER — FENTANYL CITRATE (PF) 100 MCG/2ML IJ SOLN
INTRAMUSCULAR | Status: AC
  Filled 2020-10-21: qty 2

## 2020-10-21 MED ORDER — ONDANSETRON HCL 4 MG PO TABS: 4.0000 mg | ORAL_TABLET | Freq: Four times a day (QID) | ORAL | Status: DC | PRN

## 2020-10-21 MED ORDER — BUPIVACAINE-EPINEPHRINE (PF) 0.5% -1:200000 IJ SOLN
INTRAMUSCULAR | Status: AC
  Filled 2020-10-21: qty 30

## 2020-10-21 MED ORDER — BUPIVACAINE LIPOSOME 1.3 % IJ SUSP
INTRAMUSCULAR | Status: AC
  Filled 2020-10-21: qty 20

## 2020-10-21 MED ORDER — OXYCODONE HCL 5 MG PO TABS: 5.0000 mg | ORAL_TABLET | Freq: Once | ORAL | Status: DC | PRN

## 2020-10-21 MED ORDER — ACETAMINOPHEN 10 MG/ML IV SOLN
INTRAVENOUS | Status: AC
  Filled 2020-10-21: qty 100

## 2020-10-21 MED ORDER — SODIUM CHLORIDE 0.9 % IV SOLN
INTRAVENOUS | Status: DC | PRN
  Administered 2020-10-21: 90 mL

## 2020-10-21 MED ORDER — FENTANYL CITRATE (PF) 100 MCG/2ML IJ SOLN: 25.0000 ug | INTRAMUSCULAR | Status: DC | PRN

## 2020-10-21 MED ORDER — KETOROLAC TROMETHAMINE 30 MG/ML IJ SOLN
30.0000 mg | Freq: Once | INTRAMUSCULAR | Status: AC
  Administered 2020-10-21: 30 mg via INTRAVENOUS

## 2020-10-21 MED ORDER — KETOROLAC TROMETHAMINE 15 MG/ML IJ SOLN: 15.0000 mg | Freq: Four times a day (QID) | INTRAMUSCULAR | Status: DC

## 2020-10-21 MED ORDER — ONDANSETRON HCL 4 MG/2ML IJ SOLN
INTRAMUSCULAR | Status: DC | PRN
  Administered 2020-10-21: 4 mg via INTRAVENOUS

## 2020-10-21 MED ORDER — PROPOFOL 10 MG/ML IV BOLUS
INTRAVENOUS | Status: AC
  Filled 2020-10-21: qty 40

## 2020-10-21 MED ORDER — SODIUM CHLORIDE 0.9 % IR SOLN
Status: DC | PRN
  Administered 2020-10-21: 3000 mL

## 2020-10-21 MED ORDER — MIDAZOLAM HCL 2 MG/2ML IJ SOLN
INTRAMUSCULAR | Status: DC | PRN
  Administered 2020-10-21 (×2): 1 mg via INTRAVENOUS

## 2020-10-21 MED ORDER — ACETAMINOPHEN 500 MG PO TABS
1000.0000 mg | ORAL_TABLET | Freq: Four times a day (QID) | ORAL | Status: DC
  Administered 2020-10-21: 1000 mg via ORAL

## 2020-10-21 MED ORDER — KETOROLAC TROMETHAMINE 30 MG/ML IJ SOLN
INTRAMUSCULAR | Status: AC
  Filled 2020-10-21: qty 1

## 2020-10-21 MED ORDER — APIXABAN 2.5 MG PO TABS: 2.5000 mg | ORAL_TABLET | Freq: Two times a day (BID) | ORAL | 0 refills | Status: DC

## 2020-10-21 MED ORDER — CEFAZOLIN SODIUM-DEXTROSE 2-4 GM/100ML-% IV SOLN
2.0000 g | Freq: Four times a day (QID) | INTRAVENOUS | Status: DC
  Administered 2020-10-21: 2 g via INTRAVENOUS
  Filled 2020-10-21: qty 100

## 2020-10-21 MED ORDER — METOCLOPRAMIDE HCL 5 MG/ML IJ SOLN: 5.0000 mg | Freq: Three times a day (TID) | INTRAMUSCULAR | Status: DC | PRN

## 2020-10-21 MED ORDER — OXYCODONE HCL 5 MG PO TABS
ORAL_TABLET | ORAL | Status: AC
  Filled 2020-10-21: qty 1

## 2020-10-21 MED ORDER — SODIUM CHLORIDE 0.9 % IV SOLN: INTRAVENOUS | Status: DC

## 2020-10-21 MED ORDER — METOCLOPRAMIDE HCL 10 MG PO TABS: 5.0000 mg | ORAL_TABLET | Freq: Three times a day (TID) | ORAL | Status: DC | PRN

## 2020-10-21 MED ORDER — PROPOFOL 500 MG/50ML IV EMUL
INTRAVENOUS | Status: DC | PRN
  Administered 2020-10-21: 75 ug/kg/min via INTRAVENOUS

## 2020-10-21 MED ORDER — ACETAMINOPHEN 10 MG/ML IV SOLN
INTRAVENOUS | Status: DC | PRN
  Administered 2020-10-21: 1000 mg via INTRAVENOUS

## 2020-10-21 MED ORDER — TRAMADOL HCL 50 MG PO TABS: 50.0000 mg | ORAL_TABLET | Freq: Four times a day (QID) | ORAL | Status: DC | PRN

## 2020-10-21 MED ORDER — OXYCODONE HCL 5 MG/5ML PO SOLN: 5.0000 mg | Freq: Once | ORAL | Status: DC | PRN

## 2020-10-21 MED ORDER — PROPOFOL 500 MG/50ML IV EMUL
INTRAVENOUS | Status: AC
  Filled 2020-10-21: qty 50

## 2020-10-21 MED ORDER — OXYCODONE HCL 5 MG PO TABS
5.0000 mg | ORAL_TABLET | ORAL | Status: DC | PRN
  Administered 2020-10-21 (×2): 5 mg via ORAL

## 2020-10-21 MED ORDER — ACETAMINOPHEN 500 MG PO TABS
ORAL_TABLET | ORAL | Status: AC
  Filled 2020-10-21: qty 2

## 2020-10-21 MED ORDER — CHLORHEXIDINE GLUCONATE 0.12 % MT SOLN
OROMUCOSAL | Status: AC
  Administered 2020-10-21: 15 mL via OROMUCOSAL
  Filled 2020-10-21: qty 15

## 2020-10-21 MED ORDER — SODIUM CHLORIDE 0.9 % BOLUS PEDS
250.0000 mL | Freq: Once | INTRAVENOUS | Status: AC
  Administered 2020-10-21: 250 mL via INTRAVENOUS

## 2020-10-21 MED ORDER — SODIUM CHLORIDE 0.9 % IV SOLN
INTRAVENOUS | Status: DC | PRN
  Administered 2020-10-21: 75 ug/min via INTRAVENOUS

## 2020-10-21 MED ORDER — BUPIVACAINE HCL (PF) 0.5 % IJ SOLN
INTRAMUSCULAR | Status: DC | PRN
  Administered 2020-10-21: 14 mg via INTRATHECAL

## 2020-10-21 MED ORDER — ONDANSETRON HCL 4 MG/2ML IJ SOLN: 4.0000 mg | Freq: Once | INTRAMUSCULAR | Status: DC | PRN

## 2020-10-21 MED ORDER — DEXAMETHASONE SODIUM PHOSPHATE 10 MG/ML IJ SOLN
INTRAMUSCULAR | Status: DC | PRN
  Administered 2020-10-21: 5 mg via INTRAVENOUS

## 2020-10-21 MED ORDER — MIDAZOLAM HCL 2 MG/2ML IJ SOLN
INTRAMUSCULAR | Status: AC
  Filled 2020-10-21: qty 2

## 2020-10-21 MED ORDER — ONDANSETRON HCL 4 MG/2ML IJ SOLN: 4.0000 mg | Freq: Four times a day (QID) | INTRAMUSCULAR | Status: DC | PRN

## 2020-10-21 MED ORDER — KETOROLAC TROMETHAMINE 30 MG/ML IJ SOLN
INTRAMUSCULAR | Status: AC
  Administered 2020-10-21: 30 mg
  Filled 2020-10-21: qty 1

## 2020-10-21 SURGICAL SUPPLY — 61 items
APL PRP STRL LF DISP 70% ISPRP (MISCELLANEOUS) ×1
BLADE SAW SAG 25X90X1.19 (BLADE) ×3 IMPLANT
BLADE SURG SZ20 CARB STEEL (BLADE) ×3 IMPLANT
BNDG CMPR STD VLCR NS LF 5.8X6 (GAUZE/BANDAGES/DRESSINGS) ×1
BNDG ELASTIC 6X5.8 VLCR NS LF (GAUZE/BANDAGES/DRESSINGS) ×3 IMPLANT
BRNG TIB 0D 75X10 ANT STAB (Insert) ×1 IMPLANT
CANISTER SUCT 1200ML W/VALVE (MISCELLANEOUS) ×3 IMPLANT
CANISTER SUCT 3000ML PPV (MISCELLANEOUS) ×3 IMPLANT
CEMENT BONE R 1X40 (Cement) ×6 IMPLANT
CEMENT VACUUM MIXING SYSTEM (MISCELLANEOUS) ×3 IMPLANT
CHLORAPREP W/TINT 26 (MISCELLANEOUS) ×3 IMPLANT
COOLER POLAR GLACIER W/PUMP (MISCELLANEOUS) ×3 IMPLANT
COVER MAYO STAND REUSABLE (DRAPES) ×3 IMPLANT
COVER WAND RF STERILE (DRAPES) ×3 IMPLANT
CUFF TOURN SGL QUICK 30 (TOURNIQUET CUFF) ×3
CUFF TRNQT CYL 30X4X21-28X (TOURNIQUET CUFF) ×1 IMPLANT
DRAPE 3/4 80X56 (DRAPES) ×3 IMPLANT
DRAPE IMP U-DRAPE 54X76 (DRAPES) ×3 IMPLANT
DRSG MEPILEX SACRM 8.7X9.8 (GAUZE/BANDAGES/DRESSINGS) ×3 IMPLANT
DRSG OPSITE POSTOP 4X10 (GAUZE/BANDAGES/DRESSINGS) ×6 IMPLANT
ELECT REM PT RETURN 9FT ADLT (ELECTROSURGICAL) ×3
ELECTRODE REM PT RTRN 9FT ADLT (ELECTROSURGICAL) ×1 IMPLANT
FEMORAL COMPONENT 70MM RIGHT (Joint) ×3 IMPLANT
GAUZE XEROFORM 1X8 LF (GAUZE/BANDAGES/DRESSINGS) ×3 IMPLANT
GLOVE BIO SURGEON STRL SZ7.5 (GLOVE) ×12 IMPLANT
GLOVE BIO SURGEON STRL SZ8 (GLOVE) ×12 IMPLANT
GLOVE BIOGEL PI IND STRL 8 (GLOVE) ×1 IMPLANT
GLOVE BIOGEL PI INDICATOR 8 (GLOVE) ×2
GLOVE INDICATOR 8.0 STRL GRN (GLOVE) ×3 IMPLANT
GOWN STRL REUS W/ TWL LRG LVL3 (GOWN DISPOSABLE) ×1 IMPLANT
GOWN STRL REUS W/ TWL XL LVL3 (GOWN DISPOSABLE) ×1 IMPLANT
GOWN STRL REUS W/TWL LRG LVL3 (GOWN DISPOSABLE) ×3
GOWN STRL REUS W/TWL XL LVL3 (GOWN DISPOSABLE) ×3
HOOD PEEL AWAY FLYTE STAYCOOL (MISCELLANEOUS) ×9 IMPLANT
IMMBOLIZER KNEE 19 BLUE UNIV (SOFTGOODS) ×3 IMPLANT
INSERT TIB BEARING 75 10 (Insert) ×3 IMPLANT
KIT TURNOVER KIT A (KITS) ×3 IMPLANT
MANIFOLD NEPTUNE II (INSTRUMENTS) ×3 IMPLANT
NEEDLE SPNL 20GX3.5 QUINCKE YW (NEEDLE) ×3 IMPLANT
NS IRRIG 1000ML POUR BTL (IV SOLUTION) ×3 IMPLANT
PACK TOTAL KNEE (MISCELLANEOUS) ×3 IMPLANT
PAD WRAPON POLAR KNEE (MISCELLANEOUS) ×1 IMPLANT
PEG PATELLA SERIES A 37MMX10MM (Orthopedic Implant) ×3 IMPLANT
PENCIL SMOKE EVACUATOR (MISCELLANEOUS) ×3 IMPLANT
PLATE KNEE TIBIAL 75MM FIXED (Plate) ×3 IMPLANT
PULSAVAC PLUS IRRIG FAN TIP (DISPOSABLE) ×3
SOL .9 NS 3000ML IRR  AL (IV SOLUTION) ×2
SOL .9 NS 3000ML IRR AL (IV SOLUTION) ×1
SOL .9 NS 3000ML IRR UROMATIC (IV SOLUTION) ×1 IMPLANT
STAPLER SKIN PROX 35W (STAPLE) ×3 IMPLANT
SUCTION FRAZIER HANDLE 10FR (MISCELLANEOUS) ×2
SUCTION TUBE FRAZIER 10FR DISP (MISCELLANEOUS) ×1 IMPLANT
SUT VIC AB 0 CT1 36 (SUTURE) ×9 IMPLANT
SUT VIC AB 2-0 CT1 (SUTURE) ×3 IMPLANT
SUT VIC AB 2-0 CT1 27 (SUTURE) ×9
SUT VIC AB 2-0 CT1 TAPERPNT 27 (SUTURE) ×3 IMPLANT
SYR 10ML LL (SYRINGE) ×3 IMPLANT
SYR 20ML LL LF (SYRINGE) ×3 IMPLANT
SYR 30ML LL (SYRINGE) ×6 IMPLANT
TIP FAN IRRIG PULSAVAC PLUS (DISPOSABLE) ×1 IMPLANT
WRAPON POLAR PAD KNEE (MISCELLANEOUS) ×3

## 2020-10-21 NOTE — Op Note (Signed)
10/21/2020  10:14 AM  Patient:   Larry Luna  Pre-Op Diagnosis:   Degenerative joint disease, right knee.  Post-Op Diagnosis:   Same  Procedure:   Right TKA using all-cemented Biomet Vanguard system with a 70 mm PCR femur, a 75 mm tibial tray with a 10 mm anterior stabilized E-poly insert, and a 37 x 10 mm all-poly 3-pegged domed patella.  Surgeon:   Pascal Lux, MD  Assistant:   Cameron Proud, PA-C; Sherlene Shams, PA-S  Anesthesia:   Spinal  Findings:   As above  Complications:   None  EBL:   10 cc  Fluids:   1000 cc crystalloid  UOP:   None  TT:   100 minutes at 300 mmHg  Drains:   None  Closure:   Staples  Implants:   As above  Brief Clinical Note:   The patient is a 65 year old male with a long history of progressively worsening right knee pain. The patient's symptoms have progressed despite medications, activity modification, injections, etc. The patient's history and examination were consistent with advanced degenerative joint disease of the right knee confirmed by plain radiographs. The patient presents at this time for a right total knee arthroplasty.  Procedure:   The patient was brought into the operating room. After adequate spinal anesthesia was obtained, the patient was lain in the supine position before the right lower extremity was prepped with ChloraPrep solution and draped sterilely. Preoperative antibiotics were administered. After verifying the proper laterality with a surgical timeout, the limb was exsanguinated with an Esmarch and the tourniquet inflated to 300 mmHg.   A standard anterior approach to the knee was made through an approximately 7 inch incision. The incision was carried down through the subcutaneous tissues to expose superficial retinaculum. This was split the length of the incision and the medial flap elevated sufficiently to expose the medial retinaculum. The medial retinaculum was incised, leaving a 3-4 mm cuff of tissue on the patella.  This was extended distally along the medial border of the patellar tendon and proximally through the medial third of the quadriceps tendon. A subtotal fat pad excision was performed before the soft tissues were elevated off the anteromedial and anterolateral aspects of the proximal tibia to the level of the collateral ligaments. The anterior portions of the medial and lateral menisci were removed, as was the anterior cruciate ligament. With the knee flexed to 90, the external tibial guide was positioned and the appropriate proximal tibial cut made. This piece was taken to the back table where it was measured and found to be optimally replicated by a 75 mm component.  Attention was directed to the distal femur. The intramedullary canal was accessed through a 3/8" drill hole. The intramedullary guide was inserted and positioned in order to obtain a neutral flexion gap. The intercondylar block was positioned with care taken to avoid notching the anterior cortex of the femur. The appropriate cut was made. Next, the distal cutting block was placed at 5 of valgus alignment. Using the 9 mm slot, the distal cut was made. The distal femur was measured and found to be optimally replicated by the 70 mm component. The 70 mm 4-in-1 cutting block was positioned and first the posterior, then the posterior chamfer, the anterior chamfer, and finally the anterior cuts were made. At this point, the posterior portions medial and lateral menisci were removed. A trial reduction was performed using the appropriate femoral and tibial components with the 10 mm insert. This  demonstrated excellent stability to varus and valgus stressing both in flexion and extension while permitting full extension. Patella tracking was assessed and found to be excellent. Therefore, the tibial guide position was marked on the proximal tibia. The patella thickness was measured and found to be 23 mm. Therefore, the appropriate cut was made. The patellar  surface was measured and found to be optimally replicated by the 37 mm component. The three peg holes were drilled in place before the trial button was inserted. Patella tracking was assessed and found to be excellent, passing the "no thumb test". The lug holes were drilled into the distal femur before the trial component was removed, leaving only the tibial tray. The keel was then created using the appropriate tower, reamer, and punch.  The bony surfaces were prepared for cementing by irrigating them thoroughly with sterile saline solution via the jet lavage system. A bone plug was fashioned from some of the bone that had been removed previously and used to plug the distal femoral canal. In addition, 20 cc of Exparel diluted out to 60 cc with normal saline and 30 cc of 0.5% Sensorcaine were injected into the postero-medial and postero-lateral aspects of the knee, the medial and lateral gutter regions, and the peri-incisional tissues to help with postoperative analgesia. Meanwhile, the cement was being mixed on the back table. When it was ready, the tibial tray was cemented in first. The excess cement was removed using [employer] fast streamer. Next, the femoral component was impacted into place. Again, the excess cement was removed using [employer] fast streamer. The 10 mm trial insert was positioned and the knee brought into extension while the cement hardened. Finally, the patella was cemented into place and secured using the patellar clamp. Again, the excess cement was removed using [employer] fast streamer. Once the cement had hardened, the knee was placed through a range of motion with the findings as described above. Therefore, the trial insert was removed and, after verifying that no cement had been retained posteriorly, the permanent 10 mm anterior stabilized E-polyethylene insert was positioned and secured using the appropriate key locking mechanism. Again the knee was placed through a range of motion with the findings as  described above.  The wound was copiously irrigated with sterile saline solution using the jet lavage system before the quadriceps tendon and retinacular layer were reapproximated using #0 Vicryl interrupted sutures. The superficial retinacular layer also was closed using a running #0 Vicryl suture. A total of 10 cc of transexemic acid (TXA) was injected intra-articularly before the subcutaneous tissues were closed in several layers using 2-0 Vicryl interrupted sutures. The skin was closed using staples. A sterile honeycomb dressing was applied to the skin before the leg was wrapped with an Ace wrap to accommodate the Polar Care device. The patient was then awakened and returned to the recovery room in satisfactory condition after tolerating the procedure well.

## 2020-10-21 NOTE — TOC Progression Note (Signed)
Transition of Care [facility]) - Progression Note    Patient Details  Name: Larry Luna MRN: [id number] Date of Birth: Feb 11, 1956  Transition of Care [facility]) CM/SW [place], RN Phone Number: 10/21/2020, 2:22 PM  Clinical Narrative:      Vassie Moselle and Bedside Commode to be delivered to bedside by [employer] representative Francetta Found.      Expected Discharge Plan and Services           Expected Discharge Date: 10/21/20                                     Social Determinants of Health (SDOH) Interventions    Readmission Risk Interventions No flowsheet data found.

## 2020-10-21 NOTE — Transfer of Care (Signed)
Immediate Anesthesia Transfer of Care Note  Patient: Larry Luna  Procedure(s) Performed: TOTAL KNEE ARTHROPLASTY (Right Knee)  Patient Location: PACU  Anesthesia Type:General and Spinal  Level of Consciousness: awake and alert   Airway & Oxygen Therapy: Patient Spontanous Breathing  Post-op Assessment: Report given to RN and Post -op Vital signs reviewed and stable  Post vital signs: Reviewed and stable  Last Vitals:  Vitals Value Taken Time  BP 108/75 10/21/20 1030  Temp    Pulse 70 10/21/20 1030  Resp 29 10/21/20 1030  SpO2 97 % 10/21/20 1030  Vitals shown include unvalidated device data.  Last Pain:  Vitals:   10/21/20 1024  TempSrc:   PainSc: (P) 0-No pain         Complications: No complications documented.

## 2020-10-21 NOTE — OR Nursing (Signed)
Dr. Roland Rack in to see pt @ 1550, advises ok to give toradol now and discharge pt to home.  Also advised pt that his scripts were sent via computer to pharmacy rather than printed out.

## 2020-10-21 NOTE — H&P (Signed)
History of Present Illness: Larry Luna is a 64 y.o. who presents for his history and physical for a right total knee arthroplasty on 10/21/2020. He was last seen in clinic on 09/11/2020. He was to have total knee arthroplasty performed on 09/16/2020 but the procedure was canceled secondary to the fact that he had had a previous cortisone injection within 3 months time frame.  The patient's symptoms began several years ago and developed without any specific cause or injury. He had seen Cameron Proud, PA-C, on several occasions and has received several steroid injections for degenerative joint disease with a fusion of the right knee. These injections have provided temporary relief of his symptoms, but have not provided any more prolonged relief, prompting this referral. He reports 4-5/10 pain. The pain is located along the lateral aspect of the knee. The pain is described as aching, stabbing and throbbing. The symptoms are aggravated with normal daily activities, with sleeping, using stairs, at higher levels of activity, rising from a chair, walking, standing, standing pivot and activity in general. He is unable to reciprocate stairs and has difficulty standing or walking more than 15 to 20 minutes without having to sit down due to pain. He also describes no mechanical symptoms. He has associated swelling and deformity. He has tried acetaminophen, over-the-counter medications, anti-inflammatories and steroid injections with limited benefit.  The patient states that the pain has increased to the point where it is significantly interfering with his activities of daily living and wishes to proceed with total knee arthroplasty.  Past Medical History: . Hypertension  . Lung cancer (CMS-HCC) 01/09/2013  DIAGNOSIS: Stage IIB non-small cell lung cancer. OPERATIVE PROCEDURE: On 10/20/2006, right thoracotomy with upper lobectomy and chest wall resection of ribs 3 through 6. PATHOLOGY: T3 N0, a 6.5 cm poorly  differentiated adenocarcinoma. ADJUVANT THERAPY: Cisplatin chemotherapy through March of 2008.   Past Surgical History: . COLONOSCOPY 2016  [place], 10 yr follow up  . ESOPHAGOGASTRODOUDENOSCOPY W/BIOPSY 08/19/2017  Procedure: ESOPHAGOGASTRODUODENOSCOPY, FLEXIBLE, TRANSORAL; WITH BIOPSY, SINGLE OR MULTIPLE; Surgeon: Newell Coral., MD; Location: [facility]; Service: Gastroenterology;;  . Incision and drainage superficial right hemiscrotal abscess 08/04/2020  Surgeon: Abbie Sons, MD   Past Family History: . No Known Problems Father   Medications: . acetaminophen (TYLENOL) 500 MG tablet Take 1,000 mg by mouth as needed for Pain  . aspirin 81 MG EC tablet Take 81 mg by mouth once daily  . metoprolol succinate (TOPROL-XL) 50 MG XL tablet Take 1 tablet (50 mg total) by mouth once daily 30 tablet 11   Allergies: Barnie Del Bee Swelling and Anaphylaxis   Review of Systems: A comprehensive 14 point ROS was performed, reviewed, and the pertinent orthopaedic findings are documented in the HPI.  Physical Exam: BP (!) 140/90  Ht 160 cm (5\' 3" )  Wt 92.2 kg (203 lb 3.2 oz)  BMI 36.00 kg/m   General: Well-developed well-nourished male seen in no acute distress.   HEENT: Atraumatic,normocephalic. Pupils are equal and reactive to light. Oropharynx is clear with moist mucosa  Lungs: Clear to auscultation bilaterally   Cardiovascular: Regular rate and rhythm. Normal S1, S2. No murmurs. No appreciable gallops or rubs. Peripheral pulses are palpable.  Abdomen: Soft, non-tender, nondistended. Bowel sounds present  Extremity: Right knee exam: GAIT: mild limp and uses no assistive devices. ALIGNMENT: mild valgus SKIN: unremarkable SWELLING: mild EFFUSION: 1+ WARMTH: no warmth TENDERNESS: moderate tenderness over the lateral joint line, but only minimal medial joint line tenderness ROM:  5 to 95 degrees with pain in maximal flexion McMURRAY'S:  equivocal PATELLOFEMORAL: normal tracking with no peri-patellar tenderness and negative apprehension sign CREPITUS: no LACHMAN'S: negative PIVOT SHIFT: negative ANTERIOR DRAWER: negative POSTERIOR DRAWER: negative VARUS/VALGUS: positive pseudolaxity to valgus stressing  He is neurovascularly intact to the right lower extremity and foot.  Neurological: The patient is alert and oriented Sensation to light touch appears to be intact and within normal limits Gross motor strength appeared to be equal to 5/5  Vascular : Peripheral pulses felt to be palpable. Capillary refill appears to be intact and within normal limits No swelling or edema to the lower extremities  X-ray: AP standing, lateral and sunrise view taken in [facility] earlier in the year demonstrated severe degenerative changes mainly to the lateral compartment with 100% lateral joint space narrowing. He is noted to have some mild valgus deformity. No fractures or lytic lesions are noted. No increased calcification noted.  Impression: Degenerative arthrosis right knee  Plan:  The treatment options, including both surgical and nonsurgical choices, have been discussed in detail with the patient. The patient would like to proceed with a right total knee replacement. The risks (including bleeding, infection, nerve and/or blood vessel injury, persistent or recurrent pain, loosening or failure of the components, stiffness, weakness, need for further surgery, blood clots, strokes, heart attacks or arrhythmias, pneumonia, etc.) and benefits of the surgical procedure were discussed. The patient states his understanding and agrees to proceed. A formal written consent will be obtained by the nursing staff.   H&P reviewed and patient re-examined. No changes.

## 2020-10-21 NOTE — Anesthesia Procedure Notes (Signed)
Spinal  Patient location during procedure: OR Start time: 10/21/2020 7:45 AM End time: 10/21/2020 7:55 AM Staffing Anesthesiologist: Tera Mater, MD Resident/CRNA: Louann Sjogren, CRNA Preanesthetic Checklist Completed: patient identified, IV checked, risks and benefits discussed, surgical consent, monitors and equipment checked, pre-op evaluation and timeout performed Spinal Block Patient position: sitting Prep: ChloraPrep Patient monitoring: heart rate, continuous pulse ox and blood pressure Approach: midline Location: L3-4 Injection technique: single-shot Needle Needle type: Pencan  Needle gauge: 24 G Needle length: 10 cm Additional Notes Pt tolerated procedure

## 2020-10-21 NOTE — Discharge Instructions (Addendum)
Orthopedic discharge instructions: May shower with intact op-site dressing. Apply ice frequently to knee or use Polar Care. Start Eliquis 2.5 mg BID tomorrow for 2 weeks, then begin aspirin 325 mg daily for 4 weeks.   BID = twice daily (every 12 hrs) Take oxycodone as prescribed when needed.  May supplement with ES Tylenol if necessary. May weight-bear as tolerated - use walker or cane as needed for balance and support. Follow-up in 10-14 days or as scheduled.         PER DR. [place], YOUR MEDICATION PRESCRIPTIONS WERE SENT VIA COMPUTER TO YOUR PHARMACY AND NOT PRINTED AND GIVEN TO YOU AT [facility].  Bupivacaine Liposomal Suspension for Injection What is this medicine? BUPIVACAINE LIPOSOMAL (bue PIV a kane LIP oh som al) is an anesthetic. It causes loss of feeling in the skin or other tissues. It is used to prevent and to treat pain from some procedures. This medicine may be used for other purposes; ask your health care provider or pharmacist if you have questions. COMMON BRAND NAME(S): EXPAREL What should I tell my health care provider before I take this medicine? They need to know if you have any of these conditions:  G6PD deficiency  heart disease  kidney disease  liver disease  low blood pressure  lung or breathing disease, like asthma  an unusual or allergic reaction to bupivacaine, other medicines, foods, dyes, or preservatives  pregnant or trying to get pregnant  breast-feeding How should I use this medicine? This medicine is for injection into the affected area. It is given by a health care professional in a hospital or clinic setting. Talk to your pediatrician regarding the use of this medicine in children. Special care may be needed. Overdosage: If you think you have taken too much of this medicine contact a poison control center or emergency room at once. NOTE: This medicine is only for you. Do not share this medicine with others. What if I miss a dose? This does  not apply. What may interact with this medicine? This medicine may interact with the following medications:  acetaminophen  certain antibiotics like dapsone, nitrofurantoin, aminosalicylic acid, sulfonamides  certain medicines for seizures like phenobarbital, phenytoin, valproic acid  chloroquine  cyclophosphamide  flutamide  hydroxyurea  ifosfamide  metoclopramide  nitric oxide  nitroglycerin  nitroprusside  nitrous oxide  other local anesthetics like lidocaine, pramoxine, tetracaine  primaquine  quinine  rasburicase  sulfasalazine This list may not describe all possible interactions. Give your health care provider a list of all the medicines, herbs, non-prescription drugs, or dietary supplements you use. Also tell them if you smoke, drink alcohol, or use illegal drugs. Some items may interact with your medicine. What should I watch for while using this medicine? Your condition will be monitored carefully while you are receiving this medicine. Be careful to avoid injury while the area is numb, and you are not aware of pain. What side effects may I notice from receiving this medicine? Side effects that you should report to your doctor or health care professional as soon as possible:  allergic reactions like skin rash, itching or hives, swelling of the face, lips, or tongue  seizures  signs and symptoms of a dangerous change in heartbeat or heart rhythm like chest pain; dizziness; fast, irregular heartbeat; palpitations; feeling faint or lightheaded; falls; breathing problems  signs and symptoms of methemoglobinemia such as pale, gray, or blue colored skin; headache; fast heartbeat; shortness of breath; feeling faint or lightheaded, falls; tiredness  Side effects that usually do not require medical attention (report to your doctor or health care professional if they continue or are bothersome):  anxious  back pain  changes in taste  changes in  vision  constipation  dizziness  fever  nausea, vomiting This list may not describe all possible side effects. Call your doctor for medical advice about side effects. You may report side effects to FDA at 1-800-FDA-1088. Where should I keep my medicine? This drug is given in a hospital or clinic and will not be stored at home. NOTE: This sheet is a summary. It may not cover all possible information. If you have questions about this medicine, talk to your doctor, pharmacist, or health care provider.  2020 Elsevier/Gold Standard (2019-08-21 10:48:23)   AMBULATORY SURGERY  DISCHARGE INSTRUCTIONS   1) The drugs that you were given will stay in your system until tomorrow so for the next 24 hours you should not:  A) Drive an automobile B) Make any legal decisions C) Drink any alcoholic beverage   2) You may resume regular meals tomorrow.  Today it is better to start with liquids and gradually work up to solid foods.  You may eat anything you prefer, but it is better to start with liquids, then soup and crackers, and gradually work up to solid foods.   3) Please notify your doctor immediately if you have any unusual bleeding, trouble breathing, redness and pain at the surgery site, drainage, fever, or pain not relieved by medication.    4) Additional Instructions:  Per Christina @ office, home health PT is being arranged for you.        Please contact your physician with any problems or Same Day Surgery at [phone], Monday through Friday 6 am to 4 pm, or [place] at [facility] number at [phone].

## 2020-10-21 NOTE — Progress Notes (Signed)
   10/21/20 0730  Clinical Encounter Type  Visited With Family  Visit Type Initial  Referral From Chaplain  Consult/Referral To Chaplain  While rounding [facility] waiting area, chaplain briefly visited with Pt's wife to find out how she was doing while waiting. Pt's wife said she was sleepy and she had been here since 6:00 am. She said she was fine and all was well. She was going to get breakfast and wanted to know how long it would be before her husband would be done. Chaplain went on unit to get her an answer. Pt will be in done within two hrs and a half. Pt's wife thanked chaplain for information and she left to go get breakfast.

## 2020-10-21 NOTE — OR Nursing (Addendum)
PT in for evaluation @ 1410. Update 1439 - Rolling walker and bedside commode dispensed to pt by representative requested by case manager Sonia Baller.

## 2020-10-21 NOTE — Evaluation (Signed)
Physical Therapy Evaluation Patient Details Name: Larry Luna MRN: [id number] DOB: 05-10-1956 Today's Date: 10/21/2020   History of Present Illness  Pt admitted for R TKR. History includes HTN and lung cancer.   Clinical Impression  Pt is a pleasant 64 year old male who was admitted for R TKR. Pt performs bed mobility, transfers, and ambulation with cga and RW. Pt demonstrates ability to perform 10 SLRs with independence, therefore does not require KI for mobility. Performed stair training safely. Pt demonstrates deficits with strength/mobility/endurance/pain. Would benefit from skilled PT to address above deficits and promote optimal return to PLOF. Recommend transition to [facility] upon discharge from acute hospitalization. Has met all goals for discharge this date. Updated RN.     Follow Up Recommendations Home health PT    Equipment Recommendations  Rolling walker with 5" wheels;3in1 (PT)    Recommendations for Other Services       Precautions / Restrictions Precautions Precautions: Fall;Knee Precaution Booklet Issued: Yes (comment) Restrictions Weight Bearing Restrictions: Yes RLE Weight Bearing: Weight bearing as tolerated      Mobility  Bed Mobility Overal bed mobility: Needs Assistance Bed Mobility: Supine to Sit     Supine to sit: Min guard     General bed mobility comments: safe technique with cues for sequencing.Once seated, upright posture noted    Transfers Overall transfer level: Needs assistance Equipment used: Rolling walker (2 wheeled) Transfers: Sit to/from Stand Sit to Stand: Min guard         General transfer comment: safe technique with upright posture. Good weight acceptance with RW.  Ambulation/Gait Ambulation/Gait assistance: Min guard Gait Distance (Feet): 75 Feet Assistive device: Rolling walker (2 wheeled) Gait Pattern/deviations: Step-to pattern     General Gait Details: ambulated with step to gait pattern and RW. Slight toe drag  noted on R foot compensated with increased hip flexion. Good endurance noted  Stairs Stairs: Yes Stairs assistance: Min guard Stair Management: No rails;Backwards;Step to pattern Number of Stairs: 3 General stair comments: up/down stairs with safe technique. This therapist demonstrated prior to performance.   Wheelchair Mobility    Modified Rankin (Stroke Patients Only)       Balance Overall balance assessment: Needs assistance Sitting-balance support: Feet supported Sitting balance-Leahy Scale: Good     Standing balance support: Bilateral upper extremity supported Standing balance-Leahy Scale: Good                               Pertinent Vitals/Pain Pain Assessment: 0-10 Pain Score: 2  Pain Location: R knee Pain Descriptors / Indicators: Operative site guarding Pain Intervention(s): Limited activity within patient's tolerance;Premedicated before session;Ice applied    Home Living Family/patient expects to be discharged to:: Private residence Living Arrangements: Spouse/significant other Available Help at Discharge: Family;Available 24 hours/day Type of Home: House Home Access: Stairs to enter Entrance Stairs-Rails: None Entrance Stairs-Number of Steps: 3 Home Layout: One level Home Equipment: Walker - standard;Cane - single point      Prior Function Level of Independence: Independent with assistive device(s)         Comments: uses SPC prior to admission due to pain     Hand Dominance        Extremity/Trunk Assessment   Upper Extremity Assessment Upper Extremity Assessment: Overall WFL for tasks assessed    Lower Extremity Assessment Lower Extremity Assessment: Generalized weakness (R LE grossly 3/5; L LE grossly 5/5)  Communication   Communication: No difficulties  Cognition Arousal/Alertness: Awake/alert Behavior During Therapy: WFL for tasks assessed/performed Overall Cognitive Status: Within Functional Limits for tasks  assessed                                        General Comments      Exercises Total Joint Exercises Goniometric ROM: R knee AAROM: 5-81 degrees Other Exercises Other Exercises: supine ther-ex performed on R LE including AP, quad sets, SLRs, hip abd/add, SAQ, LAQ, heel slides, and knee flexion stretches. All ther-ex performed x 10 reps with cga. Written HEP given and reviewed. Other Exercises: Handouts for education provided for ADLs management   Assessment/Plan    PT Assessment Patient needs continued PT services  PT Problem List Decreased strength;Decreased range of motion;Decreased activity tolerance;Decreased balance;Decreased mobility;Decreased knowledge of use of DME;Pain       PT Treatment Interventions DME instruction;Gait training;Stair training;Therapeutic exercise;Balance training    PT Goals (Current goals can be found in the Care Plan section)  Acute Rehab PT Goals Patient Stated Goal: to go home PT Goal Formulation: With patient Time For Goal Achievement: 11/04/20 Potential to Achieve Goals: Good    Frequency BID   Barriers to discharge        Co-evaluation               AM-PAC PT "6 Clicks" Mobility  Outcome Measure Help needed turning from your back to your side while in a flat bed without using bedrails?: None Help needed moving from lying on your back to sitting on the side of a flat bed without using bedrails?: None Help needed moving to and from a bed to a chair (including a wheelchair)?: None Help needed standing up from a chair using your arms (e.g., wheelchair or bedside chair)?: None Help needed to walk in hospital room?: None Help needed climbing 3-5 steps with a railing? : None 6 Click Score: 24    End of Session Equipment Utilized During Treatment: Gait belt Activity Tolerance: Patient tolerated treatment well Patient left: in chair Nurse Communication: Mobility status PT Visit Diagnosis: Muscle weakness  (generalized) (M62.81);Difficulty in walking, not elsewhere classified (R26.2);Pain Pain - Right/Left: Right Pain - part of body: Knee    Time: 3710-6269 PT Time Calculation (min) (ACUTE ONLY): 53 min   Charges:   PT Evaluation $PT Eval Low Complexity: 1 Low PT Treatments $Gait Training: 23-37 mins $Therapeutic Exercise: 8-22 mins        Greggory Stallion, PT, DPT [phone]   Roena Sassaman 10/21/2020, 4:04 PM

## 2020-10-21 NOTE — OR Nursing (Signed)
Bloody knee dressing noted while accompanying pt to wheelchair.  Dr. Roland Rack notified of same and in to see patient in postop to change dressing.  Knee immobilizer applied as instructed.  MD told pt to keep immobilizer in place until Saturday, then could leave immobilizer off at that point.  Pt. Instructed to call MD if any further issues with

## 2020-10-21 NOTE — Anesthesia Preprocedure Evaluation (Signed)
Anesthesia Evaluation  Patient identified by MRN, date of birth, ID band Patient awake    Reviewed: Allergy & Precautions, H&P , NPO status , Patient's Chart, lab work & pertinent test results  History of Anesthesia Complications Negative for: history of anesthetic complications  Airway Mallampati: II  TM Distance: >3 FB     Dental  (+) Edentulous Upper, Missing   Pulmonary neg sleep apnea, neg COPD, Current Smoker and Patient abstained from smoking.,    breath sounds clear to auscultation       Cardiovascular hypertension, (-) angina(-) Past MI and (-) Cardiac Stents (-) dysrhythmias  Rhythm:regular Rate:Normal + Systolic murmurs    Neuro/Psych negative neurological ROS  negative psych ROS   GI/Hepatic Neg liver ROS, GERD  Controlled,  Endo/Other  negative endocrine ROS  Renal/GU      Musculoskeletal   Abdominal   Peds  Hematology negative hematology ROS (+)   Anesthesia Other Findings Past Medical History: No date: Arthritis     Comment:  Knees, Shoulder Right 2007: Cancer ([place])     Comment:  lung No date: GERD (gastroesophageal reflux disease) No date: Hypertension No date: Poorly fitting dentures No date: Uses hearing aid     Comment:  Pt does not wear consistantly  Past Surgical History: 09/21/2017: CATARACT EXTRACTION W/PHACO; Left     Comment:  Procedure: CATARACT EXTRACTION PHACO AND INTRAOCULAR               LENS PLACEMENT ([place])  LEFT;  Surgeon: Leandrew Koyanagi, MD;  Location: [facility];  Service:               Ophthalmology;  Laterality: Left; 2016: COLONOSCOPY 04/05/2018: COLONOSCOPY WITH PROPOFOL; N/A     Comment:  Procedure: COLONOSCOPY WITH PROPOFOL;  Surgeon: Robert Bellow, MD;  Location: [facility];  Service:               Endoscopy;  Laterality: N/A; No date: EYE SURGERY 08/05/2020: INCISION AND DRAINAGE ABSCESS; Right     Comment:   Procedure: INCISION AND DRAINAGE Scrotal ABSCESS;                Surgeon: Abbie Sons, MD;  Location: [facility];                Service: Urology;  Laterality: Right; 10/2006: LUNG SURGERY; Right     Comment:  lung cancer surgery - [facility] 2007: RIB RESECTION; Right  BMI    Body Mass Index: 36.49 kg/m      Reproductive/Obstetrics negative OB ROS                             Anesthesia Physical Anesthesia Plan  ASA: II  Anesthesia Plan: Spinal   Post-op Pain Management:    Induction:   PONV Risk Score and Plan: Propofol infusion and Ondansetron  Airway Management Planned: Simple Face Mask  Additional Equipment:   Intra-op Plan:   Post-operative Plan:   Informed Consent: I have reviewed the patients History and Physical, chart, labs and discussed the procedure including the risks, benefits and alternatives for the proposed anesthesia with the patient or authorized representative who has indicated his/her understanding and acceptance.     Dental Advisory Given  Plan Discussed with: Anesthesiologist, CRNA and Surgeon  Anesthesia Plan Comments:         Anesthesia Quick Evaluation

## 2020-10-22 ENCOUNTER — Encounter: Payer: Self-pay | Admitting: Surgery

## 2020-10-22 NOTE — Progress Notes (Signed)
Patient's wife states they were unable to afford $100 for the blood thinner that was prescribed upon discharge from hospital.  She states she has called the office and is awaiting a call back from them.

## 2020-10-23 NOTE — Anesthesia Postprocedure Evaluation (Signed)
Anesthesia Post Note  Patient: Larry Luna  Procedure(s) Performed: TOTAL KNEE ARTHROPLASTY (Right Knee)  Patient location during evaluation: PACU Anesthesia Type: Spinal Level of consciousness: awake and alert Pain management: pain level controlled Vital Signs Assessment: post-procedure vital signs reviewed and stable Respiratory status: spontaneous breathing, nonlabored ventilation and respiratory function stable Cardiovascular status: blood pressure returned to baseline and stable Postop Assessment: no apparent nausea or vomiting Anesthetic complications: no   No complications documented.   Last Vitals:  Vitals:   10/21/20 1543 10/21/20 1624  BP: (!) 178/83 (!) 160/90  Pulse: 60 62  Resp: 18 16  Temp: (!) 36.1 C   SpO2: 95% 100%    Last Pain:  Vitals:   10/22/20 0940  TempSrc:   PainSc: 0-No pain                 Brett Canales Tumeka Chimenti

## 2021-06-24 ENCOUNTER — Other Ambulatory Visit: Payer: Self-pay

## 2021-06-24 ENCOUNTER — Ambulatory Visit: Payer: Medicare Other | Admitting: Urology

## 2021-06-24 ENCOUNTER — Encounter: Payer: Self-pay | Admitting: Urology

## 2021-06-24 VITALS — BP 137/85 | HR 76 | Ht 63.0 in | Wt 197.0 lb

## 2021-06-24 DIAGNOSIS — L732 Hidradenitis suppurativa: Secondary | ICD-10-CM

## 2021-06-25 ENCOUNTER — Encounter: Payer: Self-pay | Admitting: Urology

## 2021-06-25 MED ORDER — DOXYCYCLINE HYCLATE 100 MG PO CAPS: 100.0000 mg | ORAL_CAPSULE | Freq: Two times a day (BID) | ORAL | 0 refills | Status: DC

## 2021-06-25 NOTE — Progress Notes (Signed)
06/24/2021 8:22 AM   Larry Luna 1956-01-13 [id number]  Referring provider: Theotis Burrow, MD [address] [place],  [place] [address]  Chief Complaint  Patient presents with   Other    Scrotal abscess    HPI: 65 y.o. male presents for follow-up.  History recurrent scrotal abscesses Status post I&D right hemiscrotal abscess 08/05/2020 with draining sinus tracts consistent with hidradenitis suppurativa When I&D was performed no significant abscess cavity identified He complains of intermittent scrotal pain, swelling and drainage Recently placed on doxycycline Denies fever or chills   PMH: Past Medical History:  Diagnosis Date   Arthritis    Knees, Shoulder Right   Cancer ([place]) 2007   lung   GERD (gastroesophageal reflux disease)    Hypertension    Poorly fitting dentures    Uses hearing aid    Pt does not wear consistantly    Surgical History: Past Surgical History:  Procedure Laterality Date   CATARACT EXTRACTION W/PHACO Left 09/21/2017   Procedure: CATARACT EXTRACTION PHACO AND INTRAOCULAR LENS PLACEMENT ([place])  LEFT;  Surgeon: Leandrew Koyanagi, MD;  Location: [facility];  Service: Ophthalmology;  Laterality: Left;   COLONOSCOPY  2016   COLONOSCOPY WITH PROPOFOL N/A 04/05/2018   Procedure: COLONOSCOPY WITH PROPOFOL;  Surgeon: Robert Bellow, MD;  Location: [facility];  Service: Endoscopy;  Laterality: N/A;   EYE SURGERY     INCISION AND DRAINAGE ABSCESS Right 08/05/2020   Procedure: INCISION AND DRAINAGE Scrotal ABSCESS;  Surgeon: Abbie Sons, MD;  Location: [facility];  Service: Urology;  Laterality: Right;   LUNG SURGERY Right 10/2006   lung cancer surgery - [facility]   RIB RESECTION Right 2007   TOTAL KNEE ARTHROPLASTY Right 10/21/2020   Procedure: TOTAL KNEE ARTHROPLASTY;  Surgeon: Corky Mull, MD;  Location: [facility];  Service: Orthopedics;  Laterality: Right;    Home Medications:  Allergies as of 06/24/2021        Reactions   Bee Venom Anaphylaxis        Medication List        Accurate as of June 24, 2021 11:59 PM. If you have any questions, ask your nurse or doctor.          acetaminophen 500 MG tablet Commonly known as: TYLENOL Take 1,000 mg by mouth every 6 (six) hours as needed for mild pain or moderate pain.   amLODipine 10 MG tablet Commonly known as: NORVASC Take 10 mg by mouth daily.   apixaban 2.5 MG Tabs tablet Commonly known as: Eliquis Take 1 tablet (2.5 mg total) by mouth 2 (two) times daily.   apixaban 2.5 MG Tabs tablet Commonly known as: Eliquis Take 1 tablet (2.5 mg total) by mouth 2 (two) times daily.   hydrochlorothiazide 12.5 MG capsule Commonly known as: MICROZIDE Take 12.5 mg by mouth daily.   metoprolol succinate 50 MG 24 hr tablet Commonly known as: TOPROL-XL Take 50 mg by mouth daily. Take with or immediately following a meal.   oxyCODONE 5 MG immediate release tablet Commonly known as: Roxicodone Take 1-2 tablets (5-10 mg total) by mouth every 4 (four) hours as needed for moderate pain or severe pain.   oxyCODONE 5 MG immediate release tablet Commonly known as: Roxicodone Take 1-2 tablets (5-10 mg total) by mouth every 4 (four) hours as needed.        Allergies:  Allergies  Allergen Reactions   Bee Venom Anaphylaxis    Family History: Family History  Problem Relation Age of Onset   Heart attack Father    Heart disease Mother    Cancer Sister        breast    Social History:  reports that he has been smoking cigarettes. He has a 11.25 pack-year smoking history. He has never used smokeless tobacco. He reports that he does not drink alcohol and does not use drugs.   Physical Exam: BP 137/85   Pulse 76   Ht '5\' 3"'$  (1.6 m)   Wt 197 lb (89.4 kg)   BMI 34.90 kg/m   Constitutional:  Alert and oriented, No acute distress. HEENT: [place] AT, moist mucus membranes.  Trachea midline, no masses. Cardiovascular: No clubbing, cyanosis, or  edema. Respiratory: Normal respiratory effort, no increased work of breathing. GI: Abdomen is soft, nontender, nondistended, no abdominal masses GU: Phallus without lesions, testes descended bilateral without masses or tenderness right posterior scrotum at site of prior I&D is indurated area with minimal drainage present.  No fluctuance.  No scrotal erythema, tenderness, crepitus or necrosis Skin: As above Neurologic: Grossly intact, no focal deficits, moving all 4 extremities. Psychiatric: Normal mood and affect.   Assessment & Plan:    1.  Hidradenitis suppurativa right groin Continue doxycycline, refill sent to pharmacy [facility]erral [facility]   Abbie Sons, [facility] [address], [place], [place] [address] [phone]

## 2021-09-30 ENCOUNTER — Ambulatory Visit: Payer: Medicare Other | Admitting: Dermatology

## 2022-01-04 ENCOUNTER — Ambulatory Visit: Payer: Medicare Other | Admitting: Dermatology

## 2022-06-21 ENCOUNTER — Ambulatory Visit (INDEPENDENT_AMBULATORY_CARE_PROVIDER_SITE_OTHER): Payer: Medicare Other | Admitting: Dermatology

## 2022-06-21 DIAGNOSIS — L732 Hidradenitis suppurativa: Secondary | ICD-10-CM

## 2022-06-21 MED ORDER — DOXYCYCLINE HYCLATE 100 MG PO TABS: 100.0000 mg | ORAL_TABLET | Freq: Every day | ORAL | 1 refills | Status: DC

## 2022-06-21 NOTE — Progress Notes (Unsigned)
   New Patient Visit  Subjective  Larry Luna is a 66 y.o. male who presents for the following: Other (New patient recurrent boils of groin area - had one recently that ruptured, no active boils today. He was treated in the past with antibiotics (just once according to patient). Generally has breakouts with boils and drainage about 2-3 times per month).  Accompanied by wife who contributes to history.  The following portions of the chart were reviewed this encounter and updated as appropriate:   Tobacco  Allergies  Meds  Problems  Med Hx  Surg Hx  Fam Hx     Review of Systems:  No other skin or systemic complaints except as noted in HPI or Assessment and Plan.  Objective  Well appearing patient in no apparent distress; mood and affect are within normal limits.  A focused examination was performed including groin area. Relevant physical exam findings are noted in the Assessment and Plan. Erythema tenderness scarring and edema of the groin and perineum area  Assessment & Plan  Hidradenitis suppurativa Groin area  Hidradenitis Suppurativa is a chronic; persistent; non-curable, but treatable condition due to abnormal inflamed sweat glands in the body folds (axilla, inframammary, groin, medial thighs), causing recurrent painful draining cysts and scarring. It can be associated with severe scarring acne and cysts; also abscesses and scarring of scalp. The goal is control and prevention of flares, as it is not curable. Scars are permanent and can be thickened. Treatment may include daily use of topical medication and oral antibiotics.  Oral isotretinoin may also be helpful.  For more severe cases, Humira (a biologic injection) may be prescribed to decrease the inflammatory process and prevent flares.  When indicated, inflamed cysts may also be treated surgically.   Start Doxycycline 100 mg 1 po qd with food and plenty of fluid. May consider Isotretinoin in the future if not  improving.  doxycycline (VIBRA-TABS) 100 MG tablet - Groin area Take 1 tablet (100 mg total) by mouth daily. With food and plenty of fluid   Return in about 3 months (around 09/21/2022) for Follow up.  I, Ashok Cordia, CMA, am acting as scribe for Sarina Ser, MD . Documentation: I have reviewed the above documentation for accuracy and completeness, and I agree with the above.  Sarina Ser, MD

## 2022-06-21 NOTE — Patient Instructions (Signed)
Hidradenitis Suppurativa is a chronic; persistent; non-curable, but treatable condition due to abnormal inflamed sweat glands in the body folds (axilla, inframammary, groin, medial thighs), causing recurrent painful draining cysts and scarring. It can be associated with severe scarring acne and cysts; also abscesses and scarring of scalp. The goal is control and prevention of flares, as it is not curable. Scars are permanent and can be thickened. Treatment may include daily use of topical medication and oral antibiotics.  Oral isotretinoin may also be helpful.  For more severe cases, Humira (a biologic injection) may be prescribed to decrease the inflammatory process and prevent flares.  When indicated, inflamed cysts may also be treated surgically.   Due to recent changes in healthcare laws, you may see results of your pathology and/or laboratory studies on MyChart before the doctors have had a chance to review them. We understand that in some cases there may be results that are confusing or concerning to you. Please understand that not all results are received at the same time and often the doctors may need to interpret multiple results in order to provide you with the best plan of care or course of treatment. Therefore, we ask that you please give [place] 2 business days to thoroughly review all your results before contacting the office for clarification. Should we see a critical lab result, you will be contacted sooner.   If You Need Anything After Your Visit  If you have any questions or concerns for your doctor, please call our main line at [phone] and press option 4 to reach your doctor's medical assistant. If no one answers, please leave a voicemail as directed and we will return your call as soon as possible. Messages left after 4 pm will be answered the following business day.   You may also send [place] a message via [place]. We typically respond to MyChart messages within 1-2 business days.  For  prescription refills, please ask your pharmacy to contact our office. Our fax number is [phone].  If you have an urgent issue when the clinic is closed that cannot wait until the next business day, you can page your doctor at the number below.    Please note that while we do our best to be available for urgent issues outside of office hours, we are not available 24/7.   If you have an urgent issue and are unable to reach [place], you may choose to seek medical care at your doctor's office, retail clinic, urgent care center, or emergency room.  If you have a medical emergency, please immediately call 911 or go to the emergency department.  Pager Numbers  - Dr. Nehemiah Massed: [phone]  - Dr. Laurence Ferrari: [phone]  - Dr. Nicole Kindred: [phone]  In the event of inclement weather, please call our main line at [phone] for an update on the status of any delays or closures.  Dermatology Medication Tips: Please keep the boxes that topical medications come in in order to help keep track of the instructions about where and how to use these. Pharmacies typically print the medication instructions only on the boxes and not directly on the medication tubes.   If your medication is too expensive, please contact our office at [phone] option 4 or send [place] a message through [place].   We are unable to tell what your co-pay for medications will be in advance as this is different depending on your insurance coverage. However, we may be able to find a substitute medication at lower cost or  fill out paperwork to get insurance to cover a needed medication.   If a prior authorization is required to get your medication covered by your insurance company, please allow [place] 1-2 business days to complete this process.  Drug prices often vary depending on where the prescription is filled and some pharmacies may offer cheaper prices.  The website www.goodrx.com contains coupons for medications through different  pharmacies. The prices here do not account for what the cost may be with help from insurance (it may be cheaper with your insurance), but the website can give you the price if you did not use any insurance.  - You can print the associated coupon and take it with your prescription to the pharmacy.  - You may also stop by our office during regular business hours and pick up a GoodRx coupon card.  - If you need your prescription sent electronically to a different pharmacy, notify our office through [facility] or by phone at [phone] option 4.     Si Usted Necesita Algo Despus de Su Visita  Tambin puede enviarnos un mensaje a travs de Pharmacist, community. Por lo general respondemos a los mensajes de MyChart en el transcurso de 1 a 2 das hbiles.  Para renovar recetas, por favor pida a su farmacia que se ponga en contacto con nuestra oficina. Harland Dingwall de fax es [place] [phone].  Si tiene un asunto urgente cuando la clnica est cerrada y que no puede esperar hasta el siguiente da hbil, puede llamar/localizar a su doctor(a) al nmero que aparece a continuacin.   Por favor, tenga en cuenta que aunque hacemos todo lo posible para estar disponibles para asuntos urgentes fuera del horario de [place], no estamos disponibles las 24 horas del da, los 7 das de la [place].   Si tiene un problema urgente y no puede comunicarse con nosotros, puede optar por buscar atencin mdica  en el consultorio de su doctor(a), en una clnica privada, en un centro de atencin urgente o en una sala de emergencias.  Si tiene Engineering geologist, por favor llame inmediatamente al 911 o vaya a la sala de emergencias.  Nmeros de bper  - Dr. Nehemiah Massed: [phone]  - Dra. Moye: [phone]  - Dra. Nicole Kindred: [phone]  En caso de inclemencias del [place], por favor llame a Johnsie Kindred principal al [phone] para una actualizacin sobre el [place] de cualquier retraso o cierre.  Consejos para la  medicacin en dermatologa: Por favor, guarde las cajas en las que vienen los medicamentos de uso tpico para ayudarle a seguir las instrucciones sobre dnde y cmo usarlos. Las farmacias generalmente imprimen las instrucciones del medicamento slo en las cajas y no directamente en los tubos del [place].   Si su medicamento es muy caro, por favor, pngase en contacto con Zigmund Daniel llamando al [phone] y presione la opcin 4 o envenos un mensaje a travs de Pharmacist, community.   No podemos decirle cul ser su copago por los medicamentos por adelantado ya que esto es diferente dependiendo de la cobertura de su seguro. Sin embargo, es posible que podamos encontrar un medicamento sustituto a Electrical engineer un formulario para que el seguro cubra el medicamento que se considera necesario.   Si se requiere una autorizacin previa para que su compaa de seguros [place] su medicamento, por favor permtanos de 1 a 2 das hbiles para completar este proceso.  Los precios de los medicamentos varan con frecuencia dependiendo del Environmental consultant de dnde se surte la receta  y [place] farmacias pueden ofrecer precios ms baratos.  El sitio web www.goodrx.com tiene cupones para medicamentos de Airline pilot. Los precios aqu no tienen en cuenta lo que podra costar con la ayuda del seguro (puede ser ms barato con su seguro), pero el sitio web puede darle el precio si no utiliz Research scientist (physical sciences).  - Puede imprimir el cupn correspondiente y llevarlo con su receta a la farmacia.  - Tambin puede pasar por nuestra oficina durante el horario de atencin regular y Charity fundraiser una tarjeta de cupones de GoodRx.  - Si necesita que su receta se enve electrnicamente a una farmacia diferente, informe a nuestra oficina a travs de MyChart de [place] o por telfono llamando al [phone] y presione la opcin 4.

## 2022-06-22 ENCOUNTER — Encounter: Payer: Self-pay | Admitting: Dermatology

## 2022-08-02 DIAGNOSIS — R9431 Abnormal electrocardiogram [ECG] [EKG]: Secondary | ICD-10-CM | POA: Insufficient documentation

## 2022-08-02 DIAGNOSIS — E785 Hyperlipidemia, unspecified: Secondary | ICD-10-CM | POA: Insufficient documentation

## 2022-08-02 DIAGNOSIS — I340 Nonrheumatic mitral (valve) insufficiency: Secondary | ICD-10-CM | POA: Insufficient documentation

## 2022-08-17 ENCOUNTER — Encounter: Payer: Self-pay | Admitting: Ophthalmology

## 2022-08-18 NOTE — Discharge Instructions (Signed)

## 2022-08-24 ENCOUNTER — Other Ambulatory Visit: Payer: Self-pay

## 2022-08-24 ENCOUNTER — Ambulatory Visit
Admission: RE | Admit: 2022-08-24 | Discharge: 2022-08-24 | Disposition: A | Discharge status: Home / Self Care | Payer: Medicare Other | Attending: Ophthalmology | Admitting: Ophthalmology

## 2022-08-24 ENCOUNTER — Ambulatory Visit: Payer: Medicare Other | Admitting: Anesthesiology

## 2022-08-24 ENCOUNTER — Encounter
Admission: RE | Disposition: A | Discharge status: Home / Self Care | Payer: Self-pay | Source: Home / Self Care | Attending: Ophthalmology

## 2022-08-24 ENCOUNTER — Encounter: Payer: Self-pay | Admitting: Ophthalmology

## 2022-08-24 DIAGNOSIS — H2511 Age-related nuclear cataract, right eye: Secondary | ICD-10-CM | POA: Diagnosis present

## 2022-08-24 DIAGNOSIS — E1136 Type 2 diabetes mellitus with diabetic cataract: Secondary | ICD-10-CM | POA: Insufficient documentation

## 2022-08-24 HISTORY — PX: CATARACT EXTRACTION W/PHACO: SHX586

## 2022-08-24 HISTORY — DX: Type 2 diabetes mellitus without complications: E11.9

## 2022-08-24 LAB — GLUCOSE, CAPILLARY: Glucose-Capillary: 93 mg/dL (ref 70–99)

## 2022-08-24 SURGERY — PHACOEMULSIFICATION, CATARACT, WITH IOL INSERTION
Anesthesia: Monitor Anesthesia Care | Site: Eye | Laterality: Right

## 2022-08-24 MED ORDER — MIDAZOLAM HCL 2 MG/2ML IJ SOLN
INTRAMUSCULAR | Status: DC | PRN
  Administered 2022-08-24: 2 mg via INTRAVENOUS

## 2022-08-24 MED ORDER — ARMC OPHTHALMIC DILATING DROPS
1.0000 | OPHTHALMIC | Status: DC | PRN
  Administered 2022-08-24 (×3): 1 via OPHTHALMIC

## 2022-08-24 MED ORDER — SIGHTPATH DOSE#1 BSS IO SOLN
INTRAOCULAR | Status: DC | PRN
  Administered 2022-08-24: 2 mL

## 2022-08-24 MED ORDER — BRIMONIDINE TARTRATE-TIMOLOL 0.2-0.5 % OP SOLN
OPHTHALMIC | Status: DC | PRN
  Administered 2022-08-24: 1 [drp] via OPHTHALMIC

## 2022-08-24 MED ORDER — MOXIFLOXACIN HCL 0.5 % OP SOLN
OPHTHALMIC | Status: DC | PRN
  Administered 2022-08-24: 0.2 mL via OPHTHALMIC

## 2022-08-24 MED ORDER — ACETAMINOPHEN 325 MG PO TABS: 325.0000 mg | ORAL_TABLET | ORAL | Status: DC | PRN

## 2022-08-24 MED ORDER — TETRACAINE HCL 0.5 % OP SOLN
1.0000 [drp] | OPHTHALMIC | Status: DC | PRN
  Administered 2022-08-24 (×3): 1 [drp] via OPHTHALMIC

## 2022-08-24 MED ORDER — SIGHTPATH DOSE#1 BSS IO SOLN
INTRAOCULAR | Status: DC | PRN
  Administered 2022-08-24: 47 mL via OPHTHALMIC

## 2022-08-24 MED ORDER — SIGHTPATH DOSE#1 NA CHONDROIT SULF-NA HYALURON 40-17 MG/ML IO SOLN
INTRAOCULAR | Status: DC | PRN
  Administered 2022-08-24: 1 mL via INTRAOCULAR

## 2022-08-24 MED ORDER — SIGHTPATH DOSE#1 BSS IO SOLN
INTRAOCULAR | Status: DC | PRN
  Administered 2022-08-24: 15 mL via INTRAOCULAR

## 2022-08-24 MED ORDER — ACETAMINOPHEN 160 MG/5ML PO SOLN: 325.0000 mg | ORAL | Status: DC | PRN

## 2022-08-24 MED ORDER — FENTANYL CITRATE (PF) 100 MCG/2ML IJ SOLN
INTRAMUSCULAR | Status: DC | PRN
  Administered 2022-08-24: 100 ug via INTRAVENOUS

## 2022-08-24 SURGICAL SUPPLY — 11 items
CANNULA ANT/CHMB 27G (MISCELLANEOUS) IMPLANT
CANNULA ANT/CHMB 27GA (MISCELLANEOUS) IMPLANT
CATARACT SUITE SIGHTPATH (MISCELLANEOUS) ×1 IMPLANT
FEE CATARACT SUITE SIGHTPATH (MISCELLANEOUS) ×1 IMPLANT
GLOVE SURG ENC TEXT LTX SZ8 (GLOVE) ×1 IMPLANT
GLOVE SURG TRIUMPH 8.0 PF LTX (GLOVE) ×1 IMPLANT
LENS IOL TECNIS EYHANCE 19.5 (Intraocular Lens) IMPLANT
NDL FILTER BLUNT 18X1 1/2 (NEEDLE) ×1 IMPLANT
NEEDLE FILTER BLUNT 18X1 1/2 (NEEDLE) ×1 IMPLANT
SYR 3ML LL SCALE MARK (SYRINGE) ×1 IMPLANT
WATER STERILE IRR 250ML POUR (IV SOLUTION) ×1 IMPLANT

## 2022-08-24 NOTE — H&P (Signed)
[facility]   Primary Care Physician:  Alene Mires Elyse Jarvis, MD Ophthalmologist: Dr. Benay Pillow  Pre-Procedure History & Physical: HPI:  Granger Chui Lengyel is a 66 y.o. male here for cataract surgery.   Past Medical History:  Diagnosis Date   Arthritis    Knees, Shoulder Right   Cancer ([place]) 2007   lung   GERD (gastroesophageal reflux disease)    Hypertension    Poorly fitting dentures    Type 2 diabetes mellitus ([place])    Uses hearing aid    Pt does not wear consistantly    Past Surgical History:  Procedure Laterality Date   CATARACT EXTRACTION W/PHACO Left 09/21/2017   Procedure: CATARACT EXTRACTION PHACO AND INTRAOCULAR LENS PLACEMENT ([place])  LEFT;  Surgeon: Leandrew Koyanagi, MD;  Location: [facility];  Service: Ophthalmology;  Laterality: Left;   COLONOSCOPY  2016   COLONOSCOPY WITH PROPOFOL N/A 04/05/2018   Procedure: COLONOSCOPY WITH PROPOFOL;  Surgeon: Robert Bellow, MD;  Location: [facility] ENDOS[facility];  Service: Endoscopy;  Laterality: N/A;   EYE SURGERY     INCISION AND DRAINAGE ABSCESS Right 08/05/2020   Procedure: INCISION AND DRAINAGE Scrotal ABSCESS;  Surgeon: Abbie Sons, MD;  Location: [facility] ORS;  Service: Urology;  Laterality: Right;   LUNG SURGERY Right 10/2006   lung cancer surgery - [facility]   RIB RESECTION Right 2007   TOTAL KNEE ARTHROPLASTY Right 10/21/2020   Procedure: TOTAL KNEE ARTHROPLASTY;  Surgeon: Corky Mull, MD;  Location: [facility] OR[facility];  Service: Orthopedics;  Laterality: Right;    Prior to Admission medications   Medication Sig Start Date End Date Taking? Authorizing Provider  acetaminophen (TYLENOL) 500 MG tablet Take 1,000 mg by mouth every 6 (six) hours as needed for mild pain or moderate pain.   Yes [provider]  amLODipine (NORVASC) 10 MG tablet Take 10 mg by mouth daily. 05/19/21  Yes [provider]  aspirin EC 81 MG tablet Take 81 mg by mouth in the morning and at bedtime. Swallow whole.   Yes  [provider]  atorvastatin (LIPITOR) 40 MG tablet Take 40 mg by mouth daily.   Yes [provider]  cetirizine (ZYRTEC) 10 MG tablet Take 10 mg by mouth daily.   Yes [provider]  doxycycline (VIBRA-TABS) 100 MG tablet Take 1 tablet (100 mg total) by mouth daily. With food and plenty of fluid 06/21/22  Yes Ralene Bathe, MD  hydrochlorothiazide (MICROZIDE) 12.5 MG capsule Take 12.5 mg by mouth daily. 06/13/21  Yes [provider]  metFORMIN (GLUCOPHAGE) 500 MG tablet Take 500 mg by mouth 2 (two) times daily with a meal.   Yes [provider]  metoprolol succinate (TOPROL-XL) 50 MG 24 hr tablet Take 50 mg by mouth daily. Take with or immediately following a meal.   Yes [provider]    Allergies as of 07/19/2022 - Review Complete 06/22/2022  Allergen Reaction Noted   Bee venom Anaphylaxis 01/16/2015    Family History  Problem Relation Age of Onset   Heart attack Father    Heart disease Mother    Cancer Sister        breast    Social History   Socioeconomic History   Marital status: Married    Spouse name: Not on file   Number of children: Not on file   Years of education: Not on file   Highest education level: Not on file  Occupational History   Not on file  Tobacco Use   Smoking status: Some Days    Packs/day: 0.25    Years: 45.00    Total pack years: 11.25    Types: Cigarettes   Smokeless tobacco: Never  Vaping Use   Vaping Use: Never used  Substance and Sexual Activity   Alcohol use: No    Alcohol/week: 0.0 standard drinks of alcohol   Drug use: No   Sexual activity: Yes    Birth control/protection: None  Other Topics Concern   Not on file  Social History Narrative   Not on file   Social Determinants of Health   Financial Resource Strain: Not on file  Food Insecurity: Not on file  Transportation Needs: Not on file  Physical Activity: Not on file  Stress: Not on file  Social Connections: Not on  file  Intimate Partner Violence: Not on file    Review of Systems: See HPI, otherwise negative ROS  Physical Exam: BP 118/74   Temp 97.9 F (36.6 C) (Tympanic)   Ht 5' 2.01" (1.575 m)   Wt 90.7 kg   SpO2 99%   BMI 36.57 kg/m  General:   Alert, cooperative in NAD Head:  Normocephalic and atraumatic. Respiratory:  Normal work of breathing. Cardiovascular:  RRR  Impression/Plan: Riaz A Seever is here for cataract surgery.  Risks, benefits, limitations, and alternatives regarding cataract surgery have been reviewed with the patient.  Questions have been answered.  All parties agreeable.   Birder Robson, MD  08/24/2022, 11:28 AM

## 2022-08-24 NOTE — Anesthesia Postprocedure Evaluation (Signed)
Anesthesia Post Note  Patient: Larry Luna  Procedure(s) Performed: CATARACT EXTRACTION PHACO AND INTRAOCULAR LENS PLACEMENT (I[facility]) RIGHT 5.08 00:33.6 (Right: Eye)  Patient location during evaluation: PACU Anesthesia Type: MAC Level of consciousness: awake and alert Pain management: pain level controlled Vital Signs Assessment: post-procedure vital signs reviewed and stable Respiratory status: spontaneous breathing, nonlabored ventilation, respiratory function stable and patient connected to nasal cannula oxygen Cardiovascular status: stable and blood pressure returned to baseline Postop Assessment: no apparent nausea or vomiting Anesthetic complications: no   No notable events documented.   Last Vitals:  Vitals:   08/24/22 1009  BP: 118/74  Temp: 36.6 C  SpO2: 99%    Last Pain:  Vitals:   08/24/22 1009  TempSrc: Tympanic  PainSc: 0-No pain                 Ilene Qua

## 2022-08-24 NOTE — Transfer of Care (Signed)
Immediate Anesthesia Transfer of Care Note  Patient: Larry Luna  Procedure(s) Performed: CATARACT EXTRACTION PHACO AND INTRAOCULAR LENS PLACEMENT ([facility]) RIGHT 5.08 00:33.6 (Right: Eye)  Patient Location: PACU  Anesthesia Type: MAC  Level of Consciousness: awake, alert  and patient cooperative  Airway and Oxygen Therapy: Patient Spontanous Breathing and Patient connected to supplemental oxygen  Post-op Assessment: Post-op Vital signs reviewed, Patient's Cardiovascular Status Stable, Respiratory Function Stable, Patent Airway and No signs of Nausea or vomiting  Post-op Vital Signs: Reviewed and stable  Complications: No notable events documented.

## 2022-08-24 NOTE — Op Note (Signed)
PREOPERATIVE DIAGNOSIS:  Nuclear sclerotic cataract of the right eye.   POSTOPERATIVE DIAGNOSIS:  Cataract   OPERATIVE PROCEDURE:ORPROCALL@   SURGEON:  Birder Robson, MD.   ANESTHESIA:  Anesthesiologist: Ilene Qua, MD CRNA: Tobie Poet, CRNA  1.      Managed anesthesia care. 2.      0.34m of Shugarcaine was instilled in the eye following the paracentesis.   COMPLICATIONS:  None.   TECHNIQUE:   Stop and chop   DESCRIPTION OF PROCEDURE:  The patient was examined and consented in the preoperative holding area where the aforementioned topical anesthesia was applied to the right eye and then brought back to the Operating Room where the right eye was prepped and draped in the usual sterile ophthalmic fashion and a lid speculum was placed. A paracentesis was created with the side port blade and the anterior chamber was filled with viscoelastic. A near clear corneal incision was performed with the steel keratome. A continuous curvilinear capsulorrhexis was performed with a cystotome followed by the capsulorrhexis forceps. Hydrodissection and hydrodelineation were carried out with BSS on a blunt cannula. The lens was removed in a stop and chop  technique and the remaining cortical material was removed with the irrigation-aspiration handpiece. The capsular bag was inflated with viscoelastic and the Technis ZCB00  lens was placed in the capsular bag without complication. The remaining viscoelastic was removed from the eye with the irrigation-aspiration handpiece. The wounds were hydrated. The anterior chamber was flushed with BSS and the eye was inflated to physiologic pressure. 0.140mof Vigamox was placed in the anterior chamber. The wounds were found to be water tight. The eye was dressed with Combigan. The patient was given protective glasses to wear throughout the day and a shield with which to sleep tonight. The patient was also given drops with which to begin a drop regimen today and  will follow-up with me in one day. Implant Name Type Inv. Item Serial No. Manufacturer Lot No. LRB No. Used Action  LENS IOL TECNIS EYHANCE 19.5 - [id number] Lens LENS IOL TECNIS EYHANCE 19.5 [id number]  Right 1 Implanted   Procedure(s) with comments: CATARACT EXTRACTION PHACO AND INTRAOCULAR LENS PLACEMENT (IOC) RIGHT 5.08 00:33.6 (Right) - Diabetic  Electronically signed: WiBirder Robson0/01/2022 11:55 AM

## 2022-08-24 NOTE — Anesthesia Preprocedure Evaluation (Signed)
Anesthesia Evaluation  Patient identified by MRN, date of birth, ID band Patient awake    Reviewed: Allergy & Precautions, NPO status , Patient's Chart, lab work & pertinent test results, reviewed documented beta blocker date and time   History of Anesthesia Complications Negative for: history of anesthetic complications  Airway Mallampati: III  TM Distance: >3 FB Neck ROM: Full    Dental  (+) Poor Dentition, Missing   Pulmonary neg pulmonary ROS, Current Smoker and Patient abstained from smoking.,    Pulmonary exam normal breath sounds clear to auscultation       Cardiovascular hypertension, Pt. on medications and Pt. on home beta blockers Normal cardiovascular exam Rhythm:Regular Rate:Normal     Neuro/Psych negative neurological ROS  negative psych ROS   GI/Hepatic Neg liver ROS, GERD  ,  Endo/Other  diabetes  Renal/GU negative Renal ROS  negative genitourinary   Musculoskeletal  (+) Arthritis ,   Abdominal   Peds negative pediatric ROS (+)  Hematology negative hematology ROS (+)   Anesthesia Other Findings   Reproductive/Obstetrics negative OB ROS                            Anesthesia Physical Anesthesia Plan  ASA: 3  Anesthesia Plan: MAC   Post-op Pain Management: Minimal or no pain anticipated   Induction: Intravenous  PONV Risk Score and Plan:   Airway Management Planned: Natural Airway and Nasal Cannula  Additional Equipment:   Intra-op Plan:   Post-operative Plan:   Informed Consent: I have reviewed the patients History and Physical, chart, labs and discussed the procedure including the risks, benefits and alternatives for the proposed anesthesia with the patient or authorized representative who has indicated his/her understanding and acceptance.     Dental Advisory Given  Plan Discussed with: Anesthesiologist, CRNA and Surgeon  Anesthesia Plan Comments:  (Patient consented for risks of anesthesia including but not limited to:  - adverse reactions to medications - damage to eyes, teeth, lips or other oral mucosa - nerve damage due to positioning  - sore throat or hoarseness - Damage to heart, brain, nerves, lungs, other parts of body or loss of life  Patient voiced understanding.)        Anesthesia Quick Evaluation

## 2022-08-25 ENCOUNTER — Encounter: Payer: Self-pay | Admitting: Ophthalmology

## 2022-09-29 ENCOUNTER — Ambulatory Visit: Payer: Medicare Other | Admitting: Dermatology

## 2022-12-13 ENCOUNTER — Ambulatory Visit (INDEPENDENT_AMBULATORY_CARE_PROVIDER_SITE_OTHER): Payer: Medicare Other | Admitting: Dermatology

## 2022-12-13 DIAGNOSIS — Z7189 Other specified counseling: Secondary | ICD-10-CM

## 2022-12-13 DIAGNOSIS — L732 Hidradenitis suppurativa: Secondary | ICD-10-CM | POA: Diagnosis not present

## 2022-12-13 DIAGNOSIS — Z79899 Other long term (current) drug therapy: Secondary | ICD-10-CM

## 2022-12-13 NOTE — Patient Instructions (Signed)
Due to recent changes in healthcare laws, you may see results of your pathology and/or laboratory studies on MyChart before the doctors have had a chance to review them. We understand that in some cases there may be results that are confusing or concerning to you. Please understand that not all results are received at the same time and often the doctors may need to interpret multiple results in order to provide you with the best plan of care or course of treatment. Therefore, we ask that you please give us 2 business days to thoroughly review all your results before contacting the office for clarification. Should we see a critical lab result, you will be contacted sooner.   If You Need Anything After Your Visit  If you have any questions or concerns for your doctor, please call our main line at [phone] and press option 4 to reach your doctor's medical assistant. If no one answers, please leave a voicemail as directed and we will return your call as soon as possible. Messages left after 4 pm will be answered the following business day.   You may also send us a message via MyChart. We typically respond to MyChart messages within 1-2 business days.  For prescription refills, please ask your pharmacy to contact our office. Our fax number is [phone].  If you have an urgent issue when the clinic is closed that cannot wait until the next business day, you can page your doctor at the number below.    Please note that while we do our best to be available for urgent issues outside of office hours, we are not available 24/7.   If you have an urgent issue and are unable to reach us, you may choose to seek medical care at your doctor's office, retail clinic, urgent care center, or emergency room.  If you have a medical emergency, please immediately call 911 or go to the emergency department.  Pager Numbers  - Dr. Kowalski: [phone]  - Dr. Moye: [phone]  - Dr. Stewart:  [phone]  In the event of inclement weather, please call our main line at [phone] for an update on the status of any delays or closures.  Dermatology Medication Tips: Please keep the boxes that topical medications come in in order to help keep track of the instructions about where and how to use these. Pharmacies typically print the medication instructions only on the boxes and not directly on the medication tubes.   If your medication is too expensive, please contact our office at [phone] option 4 or send us a message through MyChart.   We are unable to tell what your co-pay for medications will be in advance as this is different depending on your insurance coverage. However, we may be able to find a substitute medication at lower cost or fill out paperwork to get insurance to cover a needed medication.   If a prior authorization is required to get your medication covered by your insurance company, please allow us 1-2 business days to complete this process.  Drug prices often vary depending on where the prescription is filled and some pharmacies may offer cheaper prices.  The website www.goodrx.com contains coupons for medications through different pharmacies. The prices here do not account for what the cost may be with help from insurance (it may be cheaper with your insurance), but the website can give you the price if you did not use any insurance.  - You can print the associated coupon and take it with   your prescription to the pharmacy.  - You may also stop by our office during regular business hours and pick up a GoodRx coupon card.  - If you need your prescription sent electronically to a different pharmacy, notify our office through [facility] MyChart or by phone at [phone] option 4.     Si Usted Necesita Algo Despus de Su Visita  Tambin puede enviarnos un mensaje a travs de MyChart. Por lo general respondemos a los mensajes de MyChart en el transcurso de 1 a 2  das hbiles.  Para renovar recetas, por favor pida a su farmacia que se ponga en contacto con nuestra oficina. Nuestro nmero de fax es el [phone].  Si tiene un asunto urgente cuando la clnica est cerrada y que no puede esperar hasta el siguiente da hbil, puede llamar/localizar a su doctor(a) al nmero que aparece a continuacin.   Por favor, tenga en cuenta que aunque hacemos todo lo posible para estar disponibles para asuntos urgentes fuera del horario de oficina, no estamos disponibles las 24 horas del da, los 7 das de la semana.   Si tiene un problema urgente y no puede comunicarse con nosotros, puede optar por buscar atencin mdica  en el consultorio de su doctor(a), en una clnica privada, en un centro de atencin urgente o en una sala de emergencias.  Si tiene una emergencia mdica, por favor llame inmediatamente al 911 o vaya a la sala de emergencias.  Nmeros de bper  - Dr. Kowalski: [phone]  - Dra. Moye: [phone]  - Dra. Stewart: [phone]  En caso de inclemencias del tiempo, por favor llame a nuestra lnea principal al [phone] para una actualizacin sobre el estado de cualquier retraso o cierre.  Consejos para la medicacin en dermatologa: Por favor, guarde las cajas en las que vienen los medicamentos de uso tpico para ayudarle a seguir las instrucciones sobre dnde y cmo usarlos. Las farmacias generalmente imprimen las instrucciones del medicamento slo en las cajas y no directamente en los tubos del medicamento.   Si su medicamento es muy caro, por favor, pngase en contacto con nuestra oficina llamando al [phone] y presione la opcin 4 o envenos un mensaje a travs de MyChart.   No podemos decirle cul ser su copago por los medicamentos por adelantado ya que esto es diferente dependiendo de la cobertura de su seguro. Sin embargo, es posible que podamos encontrar un medicamento sustituto a menor costo o llenar un formulario para que el  seguro cubra el medicamento que se considera necesario.   Si se requiere una autorizacin previa para que su compaa de seguros cubra su medicamento, por favor permtanos de 1 a 2 das hbiles para completar este proceso.  Los precios de los medicamentos varan con frecuencia dependiendo del lugar de dnde se surte la receta y alguna farmacias pueden ofrecer precios ms baratos.  El sitio web www.goodrx.com tiene cupones para medicamentos de diferentes farmacias. Los precios aqu no tienen en cuenta lo que podra costar con la ayuda del seguro (puede ser ms barato con su seguro), pero el sitio web puede darle el precio si no utiliz ningn seguro.  - Puede imprimir el cupn correspondiente y llevarlo con su receta a la farmacia.  - Tambin puede pasar por nuestra oficina durante el horario de atencin regular y recoger una tarjeta de cupones de GoodRx.  - Si necesita que su receta se enve electrnicamente a una farmacia diferente, informe a nuestra oficina a travs de MyChart de [facility]   o por telfono llamando al [phone] y presione la opcin 4.  

## 2022-12-13 NOTE — Progress Notes (Signed)
   Follow-Up Visit   Subjective  Larry Luna is a 67 y.o. male who presents for the following: Other (Hidradenitis 6 month follow up - Doxycycline 100 mg 1 po qd - not helping. He is still getting new spots that drain. He is getting less than 1 new spot every month).  Accompanied by wife  The following portions of the chart were reviewed this encounter and updated as appropriate:   Tobacco  Allergies  Meds  Problems  Med Hx  Surg Hx  Fam Hx     Review of Systems:  No other skin or systemic complaints except as noted in HPI or Assessment and Plan.  Objective  Well appearing patient in no apparent distress; mood and affect are within normal limits.  A focused examination was performed including groin. Relevant physical exam findings are noted in the Assessment and Plan.   Assessment & Plan  Hidradenitis suppurativa Groin area Chronic and persistent condition with duration or expected duration over one year. Condition is symptomatic / bothersome to patient. Not to goal.  Isotretinoin Counseling; Review and Contraception Counseling: Reviewed potential side effects of isotretinoin including xerosis, cheilitis, hepatitis, hyperlipidemia, and severe birth defects if taken by a pregnant woman.  Women on isotretinoin must be celibate (not having sex) or required to use at least 2 birth control methods to prevent pregnancy (unless patient is a male of non-child bearing potential).  Females of child-bearing potential must have monthly pregnancy tests while on isotretinoin and report through I-Pledge (FDA monitoring program). Reviewed reports of suicidal ideation in those with a history of depression while taking isotretinoin and reports of diagnosis of inflammatory bowl disease (IBD) while taking isotretinoin as well as the lack of evidence for a causal relationship between isotretinoin, depression and IBD. Patient advised to reach out with any questions or concerns. Patient advised not to  share pills or donate blood while on treatment or for one month after completing treatment. All patient's considering Isotretinoin must read and understand and sign Isotretinoin Consent Form and be registered with I-Pledge.   Consent forms signed today and patient registered in [facility] program. Ipledge #[id number]. [facility] will [facility] using [facility] [address] [address].   Will plan to start isotretinoin 20 mg 1 po qd pending labs. Advised patient to discontinue doxycycline prior to starting isotretinoin.  CBC with Differential/Platelet - Groin area Comprehensive metabolic panel - Groin area Lipid panel - Groin area  Return in about 5 weeks (around 01/17/2023).  I, Ashok Cordia, CMA, am acting as scribe for Sarina Ser, MD . Documentation: I have reviewed the above documentation for accuracy and completeness, and I agree with the above.  Sarina Ser, MD

## 2022-12-14 ENCOUNTER — Telehealth: Payer: Self-pay

## 2022-12-14 LAB — CBC WITH DIFFERENTIAL/PLATELET
Basophils Absolute: 0.1 10*3/uL (ref 0.0–0.2)
Basos: 1 %
EOS (ABSOLUTE): 0.1 10*3/uL (ref 0.0–0.4)
Eos: 1 %
Hematocrit: 45.6 % (ref 37.5–51.0)
Hemoglobin: 14.4 g/dL (ref 13.0–17.7)
Immature Grans (Abs): 0 10*3/uL (ref 0.0–0.1)
Immature Granulocytes: 0 %
Lymphocytes Absolute: 3.3 10*3/uL — ABNORMAL HIGH (ref 0.7–3.1)
Lymphs: 33 %
MCH: 25 pg — ABNORMAL LOW (ref 26.6–33.0)
MCHC: 31.6 g/dL (ref 31.5–35.7)
MCV: 79 fL (ref 79–97)
Monocytes Absolute: 0.6 10*3/uL (ref 0.1–0.9)
Monocytes: 6 %
Neutrophils Absolute: 5.8 10*3/uL (ref 1.4–7.0)
Neutrophils: 59 %
Platelets: 265 10*3/uL (ref 150–450)
RBC: 5.77 x10E6/uL (ref 4.14–5.80)
RDW: 14.4 % (ref 11.6–15.4)
WBC: 10 10*3/uL (ref 3.4–10.8)

## 2022-12-14 LAB — LIPID PANEL
Chol/HDL Ratio: 3 ratio (ref 0.0–5.0)
Cholesterol, Total: 119 mg/dL (ref 100–199)
HDL: 40 mg/dL (ref >39)
LDL Chol Calc (NIH): 67 mg/dL (ref 0–99)
Triglycerides: 52 mg/dL (ref 0–149)
VLDL Cholesterol Cal: 12 mg/dL (ref 5–40)

## 2022-12-14 LAB — COMPREHENSIVE METABOLIC PANEL
ALT: 24 IU/L (ref 0–44)
AST: 19 IU/L (ref 0–40)
Albumin/Globulin Ratio: 1.6 (ref 1.2–2.2)
Albumin: 4.4 g/dL (ref 3.9–4.9)
Alkaline Phosphatase: 103 IU/L (ref 44–121)
BUN/Creatinine Ratio: 11 (ref 10–24)
BUN: 12 mg/dL (ref 8–27)
Bilirubin Total: 0.3 mg/dL (ref 0.0–1.2)
CO2: 23 mmol/L (ref 20–29)
Calcium: 9.7 mg/dL (ref 8.6–10.2)
Chloride: 103 mmol/L (ref 96–106)
Creatinine, Ser: 1.06 mg/dL (ref 0.76–1.27)
Globulin, Total: 2.7 g/dL (ref 1.5–4.5)
Glucose: 83 mg/dL (ref 70–99)
Potassium: 4.9 mmol/L (ref 3.5–5.2)
Sodium: 142 mmol/L (ref 134–144)
Total Protein: 7.1 g/dL (ref 6.0–8.5)
eGFR: 78 mL/min/{1.73_m2} (ref >59)

## 2022-12-14 MED ORDER — ISOTRETINOIN 20 MG PO CAPS: 20.0000 mg | ORAL_CAPSULE | Freq: Every day | ORAL | 0 refills | Status: DC

## 2022-12-14 NOTE — Telephone Encounter (Signed)
Discussed labs okay to start Isotretinoin, D/C Doxycycline tablets   Isotretinoin 20 mg #30 Orf erx'd to [facility]  Appt scheduled for 1 month

## 2022-12-16 ENCOUNTER — Other Ambulatory Visit: Payer: Self-pay

## 2022-12-16 MED ORDER — ISOTRETINOIN 20 MG PO CAPS: 20.0000 mg | ORAL_CAPSULE | Freq: Every day | ORAL | 0 refills | Status: AC

## 2022-12-16 NOTE — Progress Notes (Signed)
Resubmitted with ipledge #. [id number]

## 2022-12-22 ENCOUNTER — Encounter: Payer: Self-pay | Admitting: Dermatology

## 2023-01-10 ENCOUNTER — Ambulatory Visit: Payer: Medicare Other | Admitting: Dermatology

## 2023-01-29 DIAGNOSIS — N492 Inflammatory disorders of scrotum: Secondary | ICD-10-CM | POA: Insufficient documentation

## 2023-02-16 DIAGNOSIS — L732 Hidradenitis suppurativa: Secondary | ICD-10-CM | POA: Insufficient documentation

## 2024-03-06 DIAGNOSIS — I459 Conduction disorder, unspecified: Secondary | ICD-10-CM | POA: Insufficient documentation

## 2024-03-06 DIAGNOSIS — I493 Ventricular premature depolarization: Secondary | ICD-10-CM | POA: Insufficient documentation

## 2024-03-23 DIAGNOSIS — C61 Malignant neoplasm of prostate: Secondary | ICD-10-CM | POA: Insufficient documentation

## 2024-04-02 DIAGNOSIS — I4892 Unspecified atrial flutter: Secondary | ICD-10-CM | POA: Insufficient documentation

## 2024-06-18 ENCOUNTER — Encounter: Payer: Self-pay | Admitting: Nurse Practitioner

## 2024-06-18 ENCOUNTER — Other Ambulatory Visit: Payer: Self-pay | Admitting: Nurse Practitioner

## 2024-06-20 ENCOUNTER — Telehealth: Payer: Self-pay

## 2024-06-20 ENCOUNTER — Other Ambulatory Visit: Payer: Self-pay

## 2024-06-20 DIAGNOSIS — Z860100 Personal history of colon polyps, unspecified: Secondary | ICD-10-CM

## 2024-06-20 MED ORDER — NA SULFATE-K SULFATE-MG SULF 17.5-3.13-1.6 GM/177ML PO SOLN: 1.0000 | Freq: Once | ORAL | 0 refills | Status: AC

## 2024-06-20 NOTE — Telephone Encounter (Signed)
 Gastroenterology Pre-Procedure Review  Request Date: 09/24/24 Requesting Physician: Dr. Jinny  PATIENT REVIEW QUESTIONS: The patient responded to the following health history questions as indicated:    1. Are you having any GI issues? no 2. Do you have a personal history of Polyps? yes (last colonoscopy performed by Dr, Dessa 04/05/2018) 3. Do you have a family history of Colon Cancer or Polyps? no 4. Diabetes Mellitus? Prediabetic takes Metformin has been advised and noted to stop 2 days before 5. Joint replacements in the past 12 months?Laparoscopy 06/01/24 prostate due to prostate caner 6. Major health problems in the past 3 months?no 7. Any artificial heart valves, MVP, or defibrillator?Cardiac history clearance sent to Dr Italy [facility]    MEDICATIONS & ALLERGIES:    Patient reports the following regarding taking any anticoagulation/antiplatelet therapy:   Plavix, Coumadin, Eliquis , Xarelto, Lovenox, Pradaxa, Brilinta, or Effient? no Aspirin? no  Patient confirms/reports the following medications:  Current Outpatient Medications  Medication Sig Dispense Refill   acetaminophen  (TYLENOL ) 500 MG tablet Take 1,000 mg by mouth every 6 (six) hours as needed for mild pain or moderate pain.     amLODipine (NORVASC) 10 MG tablet Take 10 mg by mouth daily.     aspirin EC 81 MG tablet Take 81 mg by mouth in the morning and at bedtime. Swallow whole.     atorvastatin (LIPITOR) 40 MG tablet Take 40 mg by mouth daily.     cetirizine (ZYRTEC) 10 MG tablet Take 10 mg by mouth daily.     hydrochlorothiazide (MICROZIDE) 12.5 MG capsule Take 12.5 mg by mouth daily.     metFORMIN (GLUCOPHAGE) 500 MG tablet Take 500 mg by mouth 2 (two) times daily with a meal.     metoprolol succinate (TOPROL-XL) 50 MG 24 hr tablet Take 50 mg by mouth daily. Take with or immediately following a meal.     Na Sulfate-K Sulfate-Mg Sulfate concentrate (SUPREP) 17.5-3.13-1.6 GM/177ML SOLN Take 1 kit (354 mLs  total) by mouth once for 1 dose. 354 mL 0   No current facility-administered medications for this visit.    Patient confirms/reports the following allergies:  Allergies  Allergen Reactions   Bee Venom Anaphylaxis    No orders of the defined types were placed in this encounter.   AUTHORIZATION INFORMATION Primary Insurance: 1D#: Group #:  Secondary Insurance: 1D#: Group #:  SCHEDULE INFORMATION: Date:  Time: Location:

## 2024-06-25 ENCOUNTER — Telehealth: Payer: Self-pay

## 2024-06-25 NOTE — Telephone Encounter (Signed)
 Received Apixaban  clearance-patient is at moderate risk-ok to hold 2-3 days prior and then restart when safe from surgical perspective. Left detailed message on patients VM.

## 2024-09-24 ENCOUNTER — Encounter: Payer: Self-pay | Admitting: Gastroenterology

## 2024-09-24 ENCOUNTER — Ambulatory Visit
Admission: RE | Admit: 2024-09-24 | Discharge: 2024-09-24 | Disposition: A | Discharge status: Home / Self Care | Attending: Gastroenterology | Admitting: Gastroenterology

## 2024-09-24 ENCOUNTER — Ambulatory Visit: Admitting: Anesthesiology

## 2024-09-24 ENCOUNTER — Encounter
Admission: RE | Disposition: A | Discharge status: Home / Self Care | Payer: Self-pay | Source: Home / Self Care | Attending: Gastroenterology

## 2024-09-24 DIAGNOSIS — D120 Benign neoplasm of cecum: Secondary | ICD-10-CM | POA: Insufficient documentation

## 2024-09-24 DIAGNOSIS — D122 Benign neoplasm of ascending colon: Secondary | ICD-10-CM | POA: Insufficient documentation

## 2024-09-24 DIAGNOSIS — Z79899 Other long term (current) drug therapy: Secondary | ICD-10-CM | POA: Insufficient documentation

## 2024-09-24 DIAGNOSIS — D124 Benign neoplasm of descending colon: Secondary | ICD-10-CM | POA: Diagnosis not present

## 2024-09-24 DIAGNOSIS — E119 Type 2 diabetes mellitus without complications: Secondary | ICD-10-CM | POA: Insufficient documentation

## 2024-09-24 DIAGNOSIS — K219 Gastro-esophageal reflux disease without esophagitis: Secondary | ICD-10-CM | POA: Insufficient documentation

## 2024-09-24 DIAGNOSIS — Z1211 Encounter for screening for malignant neoplasm of colon: Secondary | ICD-10-CM | POA: Diagnosis not present

## 2024-09-24 DIAGNOSIS — I10 Essential (primary) hypertension: Secondary | ICD-10-CM | POA: Diagnosis not present

## 2024-09-24 DIAGNOSIS — Z7984 Long term (current) use of oral hypoglycemic drugs: Secondary | ICD-10-CM | POA: Insufficient documentation

## 2024-09-24 DIAGNOSIS — Z860101 Personal history of adenomatous and serrated colon polyps: Secondary | ICD-10-CM

## 2024-09-24 DIAGNOSIS — K635 Polyp of colon: Secondary | ICD-10-CM

## 2024-09-24 DIAGNOSIS — D123 Benign neoplasm of transverse colon: Secondary | ICD-10-CM | POA: Insufficient documentation

## 2024-09-24 DIAGNOSIS — I4891 Unspecified atrial fibrillation: Secondary | ICD-10-CM | POA: Diagnosis not present

## 2024-09-24 DIAGNOSIS — Z860100 Personal history of colon polyps, unspecified: Secondary | ICD-10-CM

## 2024-09-24 DIAGNOSIS — D125 Benign neoplasm of sigmoid colon: Secondary | ICD-10-CM | POA: Insufficient documentation

## 2024-09-24 HISTORY — PX: POLYPECTOMY: SHX149

## 2024-09-24 HISTORY — PX: COLONOSCOPY: SHX5424

## 2024-09-24 LAB — GLUCOSE, CAPILLARY: Glucose-Capillary: 106 mg/dL — ABNORMAL HIGH (ref 70–99)

## 2024-09-24 SURGERY — COLONOSCOPY
Anesthesia: General

## 2024-09-24 MED ORDER — LIDOCAINE HCL (PF) 2 % IJ SOLN
INTRAMUSCULAR | Status: AC
  Filled 2024-09-24: qty 10

## 2024-09-24 MED ORDER — PROPOFOL 10 MG/ML IV BOLUS
INTRAVENOUS | Status: DC | PRN
  Administered 2024-09-24 (×2): 50 mg via INTRAVENOUS

## 2024-09-24 MED ORDER — SODIUM CHLORIDE 0.9 % IV SOLN: INTRAVENOUS | Status: DC

## 2024-09-24 MED ORDER — LIDOCAINE HCL (CARDIAC) PF 100 MG/5ML IV SOSY
PREFILLED_SYRINGE | INTRAVENOUS | Status: DC | PRN
  Administered 2024-09-24: 80 mg via INTRAVENOUS

## 2024-09-24 MED ORDER — PROPOFOL 500 MG/50ML IV EMUL
INTRAVENOUS | Status: DC | PRN
  Administered 2024-09-24: 75 ug/kg/min via INTRAVENOUS

## 2024-09-24 MED ORDER — DEXMEDETOMIDINE HCL IN NACL 80 MCG/20ML IV SOLN
INTRAVENOUS | Status: DC | PRN
  Administered 2024-09-24: 8 ug via INTRAVENOUS
  Administered 2024-09-24: 12 ug via INTRAVENOUS

## 2024-09-24 NOTE — Transfer of Care (Signed)
 Immediate Anesthesia Transfer of Care Note  Patient: Larry Luna  Procedure(s) Performed: COLONOSCOPY POLYPECTOMY, INTESTINE  Patient Location: PACU  Anesthesia Type:General  Level of Consciousness: sedated  Airway & Oxygen Therapy: Patient Spontanous Breathing  Post-op Assessment: Report given to RN and Post -op Vital signs reviewed and stable  Post vital signs: Reviewed and stable  Last Vitals:  Vitals Value Taken Time  BP    Temp    Pulse 59 09/24/24 08:30  Resp 21 09/24/24 08:30  SpO2 100 % 09/24/24 08:30  Vitals shown include unfiled device data.  Last Pain:  Vitals:   09/24/24 0742  TempSrc: Temporal  PainSc: 0-No pain         Complications: No notable events documented.

## 2024-09-24 NOTE — H&P (Signed)
 Rogelia Copping, MD [facility] [address], [address] [place], [place] [address] Phone:[phone] Fax : [phone]  Primary Care Physician:  Ernie Yancy Roof, MD Primary Gastroenterologist:  Dr. Copping  Pre-Procedure History & Physical: HPI:  Larry Luna is a 68 y.o. male is here for an colonoscopy.   Past Medical History:  Diagnosis Date   Arthritis    Knees, Shoulder Right   Cancer (HCC) 2007   lung   GERD (gastroesophageal reflux disease)    Hypertension    Poorly fitting dentures    Type 2 diabetes mellitus (HCC)    Uses hearing aid    Pt does not wear consistantly    Past Surgical History:  Procedure Laterality Date   CATARACT EXTRACTION W/PHACO Left 09/21/2017   Procedure: CATARACT EXTRACTION PHACO AND INTRAOCULAR LENS PLACEMENT (IOC)  LEFT;  Surgeon: Mittie Gaskin, MD;  [facility]: [facility];  Service: Ophthalmology;  Laterality: Left;   CATARACT EXTRACTION W/PHACO Right 08/24/2022   Procedure: CATARACT EXTRACTION PHACO AND INTRAOCULAR LENS PLACEMENT (IOC) RIGHT 5.08 00:33.6;  Surgeon: Jaye Fallow, MD;  Location: [facility];  Service: Ophthalmology;  Laterality: Right;  Diabetic   COLONOSCOPY  2016   COLONOSCOPY WITH PROPOFOL  N/A 04/05/2018   Procedure: COLONOSCOPY WITH PROPOFOL ;  Surgeon: Dessa Reyes ORN, MD;  Location: [facility];  Service: Endoscopy;  Laterality: N/A;   EYE SURGERY     INCISION AND DRAINAGE ABSCESS Right 08/05/2020   Procedure: INCISION AND DRAINAGE Scrotal ABSCESS;  Surgeon: Twylla Glendia BROCKS, MD;  Location: [facility] ORS;  Service: Urology;  Laterality: Right;   LUNG SURGERY Right 10/2006   lung cancer surgery - [facility]   RIB RESECTION Right 2007   TOTAL KNEE ARTHROPLASTY Right 10/21/2020   Procedure: TOTAL KNEE ARTHROPLASTY;  Surgeon: Edie Norleen PARAS, MD;  Location: [facility] OR[facility];  Service: Orthopedics;  Laterality: Right;    Prior to Admission medications   Medication Sig Start Date End Date Taking? Authorizing  Provider  amLODipine (NORVASC) 10 MG tablet Take 10 mg by mouth daily. 05/19/21  Yes [provider]  aspirin EC 81 MG tablet Take 81 mg by mouth in the morning and at bedtime. Swallow whole.   Yes [provider]  atorvastatin (LIPITOR) 40 MG tablet Take 40 mg by mouth daily.   Yes [provider]  cetirizine (ZYRTEC) 10 MG tablet Take 10 mg by mouth daily.   Yes [provider]  hydrochlorothiazide (MICROZIDE) 12.5 MG capsule Take 12.5 mg by mouth daily. 06/13/21  Yes [provider]  metFORMIN (GLUCOPHAGE) 500 MG tablet Take 500 mg by mouth 2 (two) times daily with a meal.   Yes [provider]  metoprolol succinate (TOPROL-XL) 50 MG 24 hr tablet Take 50 mg by mouth daily. Take with or immediately following a meal.   Yes [provider]  acetaminophen  (TYLENOL ) 500 MG tablet Take 1,000 mg by mouth every 6 (six) hours as needed for mild pain or moderate pain.    [provider]    Allergies as of 06/20/2024 - Review Complete 06/20/2024  Allergen Reaction Noted   Bee venom Anaphylaxis 01/16/2015    Family History  Problem Relation Age of Onset   Heart attack Father    Heart disease Mother    Cancer Sister        breast    Social History   Socioeconomic History   Marital status: Married    Spouse name: Not on file   Number of children: Not  on file   Years of education: Not on file   Highest education level: Not on file  Occupational History   Not on file  Tobacco Use   Smoking status: Some Days    Current packs/day: 0.25    Average packs/day: 0.3 packs/day for 45.0 years (11.3 ttl pk-yrs)    Types: Cigarettes   Smokeless tobacco: Never  Vaping Use   Vaping status: Never Used  Substance and Sexual Activity   Alcohol use: No    Alcohol/week: 0.0 standard drinks of alcohol   Drug use: No   Sexual activity: Yes    Birth control/protection: None  Other Topics Concern   Not on file  Social History  Narrative   Not on file   Social Drivers of Health   Financial Resource Strain: Low Risk  (01/28/2023)   Received from [facility]   Overall Financial Resource Strain (CARDIA)    Difficulty of Paying Living Expenses: Not hard at all  Food Insecurity: No Food Insecurity (01/28/2023)   Received from [facility]   Hunger Vital Sign    Within the past 12 months, you worried that your food would run out before you got the money to buy more.: Never true    Within the past 12 months, the food you bought just didn't last and you didn't have money to get more.: Never true  Transportation Needs: No Transportation Needs (01/28/2023)   [facility] from [facility]   PRAPARE - Transportation    Lack of Transportation (Medical): No    Lack of Transportation (Non-Medical): No  Physical Activity: Not on file  Stress: Not on file  Social Connections: Not on file  Intimate Partner Violence: Not At Risk (03/05/2024)   Received from [facility]   Humiliation, Afraid, Rape, and Kick questionnaire    Within the last year, have you been afraid of your partner or ex-partner?: No    Within the last year, have you been humiliated or emotionally abused in other ways by your partner or ex-partner?: No    Within the last year, have you been kicked, hit, slapped, or otherwise physically hurt by your partner or ex-partner?: No    Within the last year, have you been raped or forced to have any kind of sexual activity by your partner or ex-partner?: No    Review of Systems: See HPI, otherwise negative ROS  Physical Exam: BP 134/86   Pulse 60   Temp (!) 97 F (36.1 C) (Temporal)   Resp 18   Ht 5' 3 (1.6 m)   Wt 83.5 kg   SpO2 100%   BMI 32.59 kg/m  General:   Alert,  pleasant and cooperative in NAD Head:  Normocephalic and atraumatic. Neck:  Supple; no masses or thyromegaly. Lungs:  Clear throughout to auscultation.    Heart:  Regular rate and rhythm. Abdomen:  Soft, nontender and  nondistended. Normal bowel sounds, without guarding, and without rebound.   Neurologic:  Alert and  oriented x4;  grossly normal neurologically.  Impression/Plan: Norleen LABOR Mcnelly is here for an colonoscopy to be performed for a history of adenomatous polyps on 2019   Risks, benefits, limitations, and alternatives regarding  colonoscopy have been reviewed with the patient.  Questions have been answered.  All parties agreeable.   Rogelia Copping, MD  09/24/2024, 7:58 AM

## 2024-09-24 NOTE — Anesthesia Preprocedure Evaluation (Addendum)
 Anesthesia Evaluation  Patient identified by MRN, date of birth, ID band Patient awake    Reviewed: Allergy & Precautions, NPO status , Patient's Chart, lab work & pertinent test results  History of Anesthesia Complications Negative for: history of anesthetic complications  Airway Mallampati: III  TM Distance: >3 FB Neck ROM: full    Dental  (+) Missing   Pulmonary neg pulmonary ROS, Patient abstained from smoking.   Pulmonary exam normal        Cardiovascular hypertension, On Medications + dysrhythmias Atrial Fibrillation + Valvular Problems/Murmurs MR      Neuro/Psych negative neurological ROS  negative psych ROS   GI/Hepatic Neg liver ROS,GERD  Medicated,,  Endo/Other  diabetes    Renal/GU negative Renal ROS  negative genitourinary   Musculoskeletal   Abdominal   Peds  Hematology negative hematology ROS (+)   Anesthesia Other Findings Past Medical History: No date: Arthritis     Comment:  Knees, Shoulder Right 2007: Cancer (HCC)     Comment:  lung No date: GERD (gastroesophageal reflux disease) No date: Hypertension No date: Poorly fitting dentures No date: Type 2 diabetes mellitus (HCC) No date: Uses hearing aid     Comment:  Pt does not wear consistantly  Past Surgical History: 09/21/2017: CATARACT EXTRACTION W/PHACO; Left     Comment:  Procedure: CATARACT EXTRACTION PHACO AND INTRAOCULAR               LENS PLACEMENT (IOC)  LEFT;  Surgeon: Mittie Gaskin, MD;  Location: [facility];  Service:               Ophthalmology;  Laterality: Left; 08/24/2022: CATARACT EXTRACTION W/PHACO; Right     Comment:  Procedure: CATARACT EXTRACTION PHACO AND INTRAOCULAR               LENS PLACEMENT (IOC) RIGHT 5.08 00:33.6;  Surgeon:               Jaye Fallow, MD;  Location: [facility];                Service: Ophthalmology;  Laterality: Right;  Diabetic 2016:  COLONOSCOPY 04/05/2018: COLONOSCOPY WITH PROPOFOL ; N/A     Comment:  Procedure: COLONOSCOPY WITH PROPOFOL ;  Surgeon: Dessa Reyes ORN, MD;  Location: [facility];  Service:               Endoscopy;  Laterality: N/A; No date: EYE SURGERY 08/05/2020: INCISION AND DRAINAGE ABSCESS; Right     Comment:  Procedure: INCISION AND DRAINAGE Scrotal ABSCESS;                Surgeon: Twylla Glendia BROCKS, MD;  Location: [facility];                Service: Urology;  Laterality: Right; 10/2006: LUNG SURGERY; Right     Comment:  lung cancer surgery - [facility] 2007: RIB RESECTION; Right 10/21/2020: TOTAL KNEE ARTHROPLASTY; Right     Comment:  Procedure: TOTAL KNEE ARTHROPLASTY;  Surgeon: Edie Norleen PARAS, MD;  Location: [facility];  Service: Orthopedics;                Laterality: Right;  BMI    Body Mass Index: 32.59 kg/m  Reproductive/Obstetrics negative OB ROS                              Anesthesia Physical Anesthesia Plan  ASA: 3  Anesthesia Plan: General   Post-op Pain Management: Minimal or no pain anticipated   Induction: Intravenous  PONV Risk Score and Plan: 1 and Propofol  infusion and TIVA  Airway Management Planned: Natural Airway and Nasal Cannula  Additional Equipment:   Intra-op Plan:   Post-operative Plan:   Informed Consent: I have reviewed the patients History and Physical, chart, labs and discussed the procedure including the risks, benefits and alternatives for the proposed anesthesia with the patient or authorized representative who has indicated his/her understanding and acceptance.     Dental Advisory Given  Plan Discussed with: Anesthesiologist, CRNA and Surgeon  Anesthesia Plan Comments: (Patient consented for risks of anesthesia including but not limited to:  - adverse reactions to medications - risk of airway placement if required - damage to eyes, teeth, lips or other oral mucosa - nerve damage due to  positioning  - sore throat or hoarseness - Damage to heart, brain, nerves, lungs, other parts of body or loss of life  Patient voiced understanding and assent.)         Anesthesia Quick Evaluation

## 2024-09-24 NOTE — Anesthesia Postprocedure Evaluation (Signed)
 Anesthesia Post Note  Patient: Larry Luna  Procedure(s) Performed: COLONOSCOPY POLYPECTOMY, INTESTINE  Patient location during evaluation: Endoscopy Anesthesia Type: General Level of consciousness: awake and alert Pain management: pain level controlled Vital Signs Assessment: post-procedure vital signs reviewed and stable Respiratory status: spontaneous breathing, nonlabored ventilation, respiratory function stable and patient connected to nasal cannula oxygen Cardiovascular status: blood pressure returned to baseline and stable Postop Assessment: no apparent nausea or vomiting Anesthetic complications: no   No notable events documented.   Last Vitals:  Vitals:   09/24/24 0831 09/24/24 0841  BP: (!) 95/59 124/73  Pulse: (!) 59 69  Resp: (!) 21 20  Temp: (!) 35.9 C   SpO2: 100% 100%    Last Pain:  Vitals:   09/24/24 0841  TempSrc:   PainSc: 0-No pain                 [name]

## 2024-09-24 NOTE — Op Note (Signed)
 [facility] Patient Name: Larry Luna Procedure Date: 09/24/2024 7:59 AM MRN: [id number] Account #: [id number] Date of Birth: Feb 20, 1956 Admit Type: Outpatient Age: 68 Room: [facility] ENDO ROOM 4 Gender: Male Note Status: Finalized Instrument Name: Colon Scope [id number] Procedure:             Colonoscopy Indications:           High risk colon cancer surveillance: Personal history                         of colonic polyps Providers:             Rogelia Copping MD, MD Referring MD:          Yancy Roof Revelo (Referring MD) Medicines:             Propofol  per Anesthesia Complications:         No immediate complications. Procedure:             Pre-Anesthesia Assessment:                        - Prior to the procedure, a History and Physical was                         performed, and patient medications and allergies were                         reviewed. The patient's tolerance of previous                         anesthesia was also reviewed. The risks and benefits                         of the procedure and the sedation options and risks                         were discussed with the patient. All questions were                         answered, and informed consent was obtained. Prior                         Anticoagulants: The patient has taken no anticoagulant                         or antiplatelet agents. ASA Grade Assessment: II - A                         patient with mild systemic disease. After reviewing                         the risks and benefits, the patient was deemed in                         satisfactory condition to undergo the procedure.                        After obtaining informed consent, the colonoscope was  passed under direct vision. Throughout the procedure,                         the patient's blood pressure, pulse, and oxygen                         saturations were monitored continuously. The                          Colonoscope was introduced through the anus and                         advanced to the the cecum, identified by appendiceal                         orifice and ileocecal valve. The colonoscopy was                         performed without difficulty. The patient tolerated                         the procedure well. The quality of the bowel                         preparation was excellent. Findings:      The perianal and digital rectal examinations were normal.      Four sessile polyps were found in the cecum. The polyps were 2 to 4 mm       in size. These polyps were removed with a cold snare. Resection and       retrieval were complete.      Eleven sessile polyps were found in the ascending colon. The polyps were       3 to 5 mm in size. These polyps were removed with a cold snare.       Resection and retrieval were complete.      Six sessile polyps were found in the transverse colon. The polyps were 3       to 5 mm in size. These polyps were removed with a cold snare. Resection       and retrieval were complete.      A 5 mm polyp was found in the descending colon. The polyp was sessile.       The polyp was removed with a cold snare. Resection and retrieval were       complete.      Two sessile polyps were found in the sigmoid colon. The polyps were 3 to       5 mm in size. These polyps were removed with a cold snare. Resection and       retrieval were complete. Impression:            - Four 2 to 4 mm polyps in the cecum, removed with a                         cold snare. Resected and retrieved.                        - Eleven 3 to 5 mm polyps in the ascending colon,  removed with a cold snare. Resected and retrieved.                        - Six 3 to 5 mm polyps in the transverse colon,                         removed with a cold snare. Resected and retrieved.                        - One 5 mm polyp in the descending colon, removed with                          a cold snare. Resected and retrieved.                        - Two 3 to 5 mm polyps in the sigmoid colon, removed                         with a cold snare. Resected and retrieved. Recommendation:        - Discharge patient to home.                        - Resume previous diet.                        - Continue present medications.                        - Await pathology results.                        - If the pathology report reveals adenomatous tissue,                         then repeat the colonoscopy for surveillance in 1 year. 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 All rights reserved. The codes documented in this report are preliminary and upon coder review may  be revised to meet current compliance requirements. Rogelia Copping MD, MD 09/24/2024 8:29:42 AM This report has been signed electronically. Number of Addenda: 0 Note Initiated On: 09/24/2024 7:59 AM Scope Withdrawal Time: 0 hours 15 minutes 17 seconds  Total Procedure Duration: 0 hours 19 minutes 2 seconds  Estimated Blood Loss:  Estimated blood loss: none.      [facility]

## 2024-09-25 LAB — SURGICAL PATHOLOGY

## 2024-09-26 ENCOUNTER — Other Ambulatory Visit: Payer: Self-pay

## 2024-09-26 ENCOUNTER — Ambulatory Visit: Payer: Self-pay | Admitting: Gastroenterology

## 2024-09-26 DIAGNOSIS — Z860100 Personal history of colon polyps, unspecified: Secondary | ICD-10-CM
